# Patient Record
Sex: Female | Born: 1990 | Hispanic: Yes | Marital: Single | Smoking: Never smoker
Health system: Southern US, Community
[De-identification: ages and names within clinical notes are randomized; demographics above are authoritative.]

## PROBLEM LIST (undated history)

## (undated) DIAGNOSIS — Z789 Other specified health status: Secondary | ICD-10-CM

## (undated) HISTORY — PX: NO PAST SURGERIES: SHX2092

## (undated) HISTORY — DX: Other specified health status: Z78.9

---

## 2011-05-30 ENCOUNTER — Encounter: Payer: Self-pay | Admitting: Family Medicine

## 2011-05-30 ENCOUNTER — Ambulatory Visit (INDEPENDENT_AMBULATORY_CARE_PROVIDER_SITE_OTHER): Payer: Self-pay | Admitting: Family Medicine

## 2011-05-30 VITALS — BP 117/77 | Temp 98.0°F | Wt 171.0 lb

## 2011-05-30 DIAGNOSIS — Z3400 Encounter for supervision of normal first pregnancy, unspecified trimester: Secondary | ICD-10-CM | POA: Insufficient documentation

## 2011-05-30 DIAGNOSIS — N898 Other specified noninflammatory disorders of vagina: Secondary | ICD-10-CM

## 2011-05-30 DIAGNOSIS — O0930 Supervision of pregnancy with insufficient antenatal care, unspecified trimester: Secondary | ICD-10-CM

## 2011-05-30 LAB — POCT URINALYSIS DIP (DEVICE)
Ketones, ur: NEGATIVE mg/dL
Protein, ur: NEGATIVE mg/dL
Specific Gravity, Urine: 1.02 (ref 1.005–1.030)
pH: 7 (ref 5.0–8.0)

## 2011-05-30 NOTE — Patient Instructions (Signed)
Embarazo - Tercer trimestre (Pregnancy - Third Trimester) El tercer trimestre del embarazo (los ltimos 3 meses) es el perodo de cambios ms rpidos que atraviesan usted y el beb. El aumento de peso es ms rpido. El beb alcanza un largo de aproximadamente 50 cm (20 pulgadas) y pesa entre 2,700 y 4,500 kg (6 a 10 libras). El beb gana ms tejido graso y ya est listo para la vida fuera del cuerpo de la madre. Mientras estn en el interior, los bebs tienen perodos de sueo y vigilia, succionan el pulgar y tienen hipo. Quizs sienta pequeas contracciones del tero. Este es el falso trabajo de parto. Tambin se las conoce como contracciones de Braxton-Hicks. Es como una prctica del parto. Los problemas ms habituales de esta etapa del embarazo incluyen mayor dificultad para respirar, hinchazn de las manos y los pies por retencin de lquidos y la necesidad de orinar con ms frecuencia debido a que el tero y el beb presionan sobre la vejiga.  EXAMENES PRENATALES  Durante los exmenes prenatales, deber seguir realizando pruebas de sangre, segn avance el embarazo. Estas pruebas se realizan para controlar su salud y la del beb. Tambin se realizan anlisis de sangre para conocer los niveles de hemoglobina. La anemia (bajo nivel de hemoglobina) es frecuente durante el embarazo. Para prevenirla, se administran hierro y vitaminas. Tambin le harn nuevas pruebas para descartar la diabetes. Podrn repetirle algunas de las pruebas que le hicieron previamente.   En cada visita le medirn el tamao del tero. Es para asegurarse de que el beb se desarrolla correctamente.   Tambin en cada visita la pesarn. Esto se realiza para asegurarse de que aumenta de peso al ritmo indicado y que usted y su beb evolucionan normalmente.   En algunas ocasiones se realiza una ecografa para confirmar el correcto desarrollo y evolucin del beb. Esta prueba se realiza con ondas sonoras inofensivas para el beb, de modo  que el profesional pueda calcular con ms precisin la fecha del parto.   Discuta las posibilidades de la anestesia si necesita cesrea.  Algunas veces se realizan pruebas especializadas del lquido amnitico que rodea al beb. Esta prueba se denomina amniocentesis. El lquido amnitico se obtiene introduciendo una aguja en el abdomen (vientre). En ocasiones se lleva a cabo cerca del final del embarazo, si es necesario adelantar el parto. En este caso se realiza para asegurarse de que los pulmones del beb estn lo suficientemente maduros como para que pueda vivir fuera del tero. CAMBIOS QUE OCURREN EN EL TERCER TRIMESTRE DEL EMBARAZO Su organismo atravesar diferentes cambios durante el embarazo que varan de una persona a otra. Converse con el profesional que la asiste acerca los cambios que usted note y que la preocupen.  Durante el ltimo trimestre probablemente sienta un aumento del apetito. Es normal tener "antojos" de ciertas comidas. Esto vara de una persona a otra y de un embarazo a otro.   Podrn aparecer las primeras estras en las caderas, abdomen y mamas. Estos son cambios normales del cuerpo durante el embarazo. No existen medicamentos ni ejercicios que puedan prevenir estos cambios.   El estreimiento puede tratarse con un laxante o agregando fibra a su dieta. Beber grandes cantidades de lquidos, tomar fibras en forma de verduras, frutas y granos integrales es de gran ayuda.   Tambin es beneficioso practicar actividad fsica. Si ha sido una persona activa hasta el embarazo, podr continuar con la mayora de las actividades durante el mismo. Si ha sido menos activa, puede ser beneficioso   que comience con un programa de ejercicios, como realizar caminatas. Consulte con el profesional que la asiste antes de comenzar un programa de ejercicios.   Evite el consumo de cigarrillos, el alcohol, los medicamentos no prescritos y las "drogas de la calle" durante el embarazo. Estas sustancias  qumicas afectan la formacin y el desarrollo del beb. Evite estas sustancias durante todo el embarazo para asegurar el nacimiento de un beb sano.   Dolor de espalda, venas varicosas y hemorroides podran aparecer o empeorar.   Los movimientos del beb pueden ser ms bruscos y aparecer ms a menudo.   Puede que note dificultades para respirar facilmente.   El ombligo podra salrsele hacia afuera.   Puede segregar un lquido amarillento (calostro) de las mamas.   Puede segregar mucus con sangre. Esto normalmente ocurre unos pocos das a una semana antes de que comience el trabajo de parto.  INSTRUCCIONES PARA EL CUIDADO DOMICILIARIO  La mayor parte de los cuidados que se aconsejan son los mismos que los indicados para las primeras etapas del embarazo. Es importante que concurra a todas las citas con el profesional y siga sus instrucciones con respecto a los medicamentos que deba utilizar, a la actividad fsica y a la dieta.   Durante el embarazo debe obtener nutrientes para usted y para su beb. Consuma alimentos balanceados a intervalos regulares. Elija alimentos como carne, pescado, leche y otros productos lcteos descremados, verduras, frutas, panes integrales y cereales. El profesional le informar cul es el aumento de peso ideal.   Las relaciones sexuales pueden continuarse hasta casi el final del embarazo, si no se presentan otros problemas como prdida prematura (antes de tiempo) de lquido amnitico, hemorragia vaginal o dolor abdominal (en el vientre).   Realice actividad fsica todos los das, si no tiene restricciones. Consulte con el profesional que la asiste si no sabe con certeza si determinados ejercicios son seguros. El mayor aumento de peso se produce en los dos ltimos trimestres del embarazo.   Haga reposo con frecuencia, con las piernas elevadas, o segn lo necesite para evitar los calambres y el dolor de cintura.   Use un buen sostn o como los que se usan para hacer  deportes para aliviar la sensibilidad de las mamas. Tambin puede serle til si lo usa mientras duerme. Si pierde calostro, podr utilizar apsitos en el sostn.   No utilice la baera con agua caliente, baos turcos y saunas.   Colquese el cinturn de seguridad cuando conduzca. Este la proteger a usted y al beb en caso de accidente.   Evite comer carne cruda y el contacto con los utensilios y desperdicios de los gatos. Estos elementos contienen grmenes que pueden causar defectos de nacimiento en el beb.   Es fcil perder algo de orina durante el embarazo. Apretar y fortalecer los msculos de la pelvis la ayudar con este problema. Practique detener la miccin cuando est en el bao. Estos son los mismos msculos que necesita fortalecer. Son tambin los mismos msculos que utiliza cuando trata de evitar los gases. Puede practicar apretando estos msculos diez veces, y repetir esto tres veces por da aproximadamente. Una vez que conozca qu msculos debe contraer, no realice estos ejercicios durante la miccin. Puede favorecerle una infeccin si la orina vuelve hacia atrs.   Pida ayuda si tiene necesidades econmicas, de asesoramiento o nutricionales durante el embarazo. El profesional podr ayudarla con respecto a estas necesidades, o derivarla a otros especialistas.   Practique la ida hasta el hospital a modo   de prueba.   Tome clases prenatales junto con su pareja para comprender, practicar y hacer preguntas acerca del trabajo de parto y el nacimiento.   Prepare la habitacin del beb.   No viaje fuera de la ciudad a menos que sea absolutamente necesario y con el consejo del mdico.   Use slo zapatos bajos sin taco para tener un mejor equilibrio y prevenir cadas.  EL CONSUMO DE MEDICAMENTOS Y DROGAS DURANTE EL EMBARAZO  Contine tomando las vitaminas apropiadas para esta etapa tal como se le indic. Las vitaminas deben contener un miligramo de cido flico y deben suplementarse con  hierro. Guarde todas las vitaminas fuera del alcance de los nios. La ingestin de slo un par de vitaminas o comprimidos que contengan hierro pueden ocasionar la muerte en un beb o en un nio pequeo.   Evite el uso de medicamentos, inclusive los de venta libre, que no hayan sido prescritos o indicados por el profesional que la asiste. Algunos medicamentos pueden causar problemas fsicos al beb. Utilice los medicamentos de venta libre o de prescripcin para el dolor, el malestar o la fiebre, segn se lo indique el profesional que lo asiste. No utilice aspirina, ibuprofeno (Motrin, Advil, Nuprin) o naproxeno (Aleve) a menos que el profesional la autorice.   El alcohol se asocia a cierto nmero de defectos del nacimiento, incluido el sndrome de alcoholismo fetal. Debe evitar el consumo de alcohol en cualquiera de sus formas. El cigarrillo causa nacimientos prematuros y bebs de bajo peso al nacer. Las drogas de la calle son muy nocivas para el beb y estn absolutamente prohibidas. Un beb que nace de una madre adicta, ser adicto al nacer. Ese beb tendr los mismos sntomas de abstinencia que un adulto.   Infrmele al profesional si consume alguna droga.  SOLICITE ATENCIN MDICA SI: Tiene alguna preocupacin durante el embarazo. Es mejor que llame para formular las preguntas si no puede esperar hasta la prxima visita, que sentirse preocupada por ellas.  DECISIONES ACERCA DE LA CIRCUNCISIN Usted puede saber o no cul es el sexo de su beb. Si es un varn, ste es el momento de pensar acerca de la circuncisin. La circuncisin es la extirpacin del prepucio. Esta es la piel que cubre el extremo sensible del pene. No hay un motivo mdico que lo justifique. Generalmente la decisin se toma segn lo que sea popular en ese momento, o se basa en creencias religiosas. Podr conversar estos temas con el profesional que la asiste. SOLICITE ATENCIN MDICA DE INMEDIATO SI:  La temperatura oral se eleva  sin motivo por encima de 102 F (38.9 C) o segn le indique el profesional que la asiste.   Tiene una prdida de lquido por la vagina (canal de parto). Si sospecha una ruptura de las membranas, tmese la temperatura y llame al profesional para informarlo sobre esto.   Observa unas pequeas manchas, una hemorragia vaginal o elimina cogulos. Avsele al profesional acerca de la cantidad y de cuntos apsitos est utilizando.   Presenta un olor desagradable en la secrecin vaginal y observa un cambio en el color, de transparente a blanco.   Ha vomitado durante ms de 24 horas.   Presenta escalofros o fiebre.   Comienza a sentir falta de aire.   Siente ardor al orinar.   Baja o sube ms de 900 g (ms de 2 libras), o segn lo indicado por el profesional que la asiste. Observa que sbitamente se le hinchan el rostro, las manos, los pies o las   piernas.   Presenta dolor abdominal. Las molestias en el ligamento redondo son una causa benigna (no cancerosa) frecuente de dolor abdominal durante el embarazo, pero el profesional que la asiste deber evaluarlo.   Presenta dolor de cabeza intenso que no se alivia.   Si no siente los movimientos del beb durante ms de tres horas. Si piensa que el beb no se mueve tanto como lo haca habitualmente, coma algo que contenga azcar y recustese sobre el lado izquierdo durante una hora. El beb debe moverse al menos 4  5 veces por hora. Comunquese inmediatamente si el beb se mueve menos que lo indicado.   Se cae, se ve involucrada en un accidente automovilstico o sufre algn tipo de traumatismo.   En su hogar hay violencia mental o fsica.  Document Released: 11/01/2004 Document Revised: 01/11/2011 [employer] Patient Information 2012 [employer]. 

## 2011-05-30 NOTE — Progress Notes (Signed)
Addended by: Doreen Salvage on: 05/30/2011 12:17 PM   Modules accepted: Orders

## 2011-05-30 NOTE — Progress Notes (Signed)
Pulse 107 Has some pelvic pressure. All first visit labs today due at 1204 for 1hr gtt.

## 2011-05-30 NOTE — Assessment & Plan Note (Signed)
Genetic Screen Late to care  Anatomic [place]   Glucose Screen   GBS   Feeding Preference breast  Contraception Depo provera  Circumcision

## 2011-05-30 NOTE — Progress Notes (Signed)
   Subjective:    Reegan Mctighe is a G1P0 [redacted]w[redacted]d being seen today for her first obstetrical visit.  Her obstetrical history is significant for late to prenatal care.. Patient does intend to breast feed. Pregnancy history fully reviewed.  Patient reports no bleeding, no contractions, no cramping and no leaking.  Filed Vitals:   05/30/11 1044  BP: 117/77  Temp: 98 F (36.7 C)  Weight: 171 lb (77.565 kg)    HISTORY: OB History    Grav Para Term Preterm Abortions TAB SAB Ect Mult Living   1              # Outc Date GA Lbr Len/2nd Wgt Sex Del Anes PTL Lv   1 CUR              Past Medical History  Diagnosis Date  . No pertinent past medical history    Past Surgical History  Procedure Date  . No past surgeries    Family History  Problem Relation Age of Onset  . Diabetes Maternal Grandmother   . Diabetes Paternal Grandmother      Exam    Uterus:   34cm  Pelvic Exam:    Perineum: No Hemorrhoids, Normal Perineum   Vulva: normal, Bartholin's, Urethra, Skene's normal   Vagina:  normal mucosa   Cervix: no cervical motion tenderness, no lesions and nulliparous appearance   Adnexa: normal adnexa  System:     Skin: normal coloration and turgor, no rashes    Neurologic: oriented, normal, negative, no focal deficits   Extremities: normal strength, tone, and muscle mass   HEENT PERRLA and extra ocular movement intact   Mouth/Teeth mucous membranes moist, pharynx normal without lesions   Neck supple and no masses   Cardiovascular: regular rate and rhythm, no murmurs or gallops   Respiratory:  appears well, vitals normal, no respiratory distress, acyanotic, normal RR, ear and throat exam is normal, neck free of mass or lymphadenopathy, chest clear, no wheezing, crepitations, rhonchi, normal symmetric air entry   Abdomen: soft, non-tender; bowel sounds normal; no masses,  no organomegaly   Urinary: urethral meatus normal      Assessment:    Pregnancy: G1P0 Patient  Active Problem List  Diagnoses  . Late prenatal care  . Supervision of normal first pregnancy        Plan:     Initial labs drawn. Prenatal vitamins. Problem list reviewed and updated.  Ultrasound discussed; fetal survey: ordered.  Follow up in 2 weeks. 45% of 50 min visit spent on counseling and coordination of care.     Candelaria Celeste JEHIEL 05/30/2011

## 2011-05-30 NOTE — Progress Notes (Signed)
Addended by: Doreen Salvage on: 05/30/2011 01:30 PM   Modules accepted: Orders

## 2011-05-31 ENCOUNTER — Other Ambulatory Visit: Payer: Self-pay | Admitting: Family Medicine

## 2011-05-31 ENCOUNTER — Telehealth: Payer: Self-pay

## 2011-05-31 LAB — OBSTETRIC PANEL
Antibody Screen: NEGATIVE
Eosinophils Relative: 0 % (ref 0–5)
HCT: 33.2 % — ABNORMAL LOW (ref 36.0–46.0)
Hemoglobin: 11.2 g/dL — ABNORMAL LOW (ref 12.0–15.0)
Lymphocytes Relative: 17 % (ref 12–46)
Lymphs Abs: 1.5 10*3/uL (ref 0.7–4.0)
MCV: 89 fL (ref 78.0–100.0)
Monocytes Absolute: 0.4 10*3/uL (ref 0.1–1.0)
RBC: 3.73 MIL/uL — ABNORMAL LOW (ref 3.87–5.11)
Rubella: 189.4 IU/mL — ABNORMAL HIGH
WBC: 9 10*3/uL (ref 4.0–10.5)

## 2011-05-31 LAB — GLUCOSE TOLERANCE, 1 HOUR: Glucose, 1 Hour GTT: 135 mg/dL (ref 70–140)

## 2011-05-31 LAB — WET PREP, GENITAL
Clue Cells Wet Prep HPF POC: NONE SEEN
Trich, Wet Prep: NONE SEEN

## 2011-05-31 NOTE — Telephone Encounter (Signed)
Called pt with Spanish interpreter and informed pt of failed 1 hr gtt and that we needed to have her come in for a 3 hr gtt. I explained to the pt the procedure and that she needed to be here in facility for the duration.  Pt stated understanding and stated that it was ok to come 06/01/11 @ 0800.  Informed Antoinette for scheduling.

## 2011-05-31 NOTE — Telephone Encounter (Signed)
Message copied by Faythe Casa on Thu May 31, 2011  3:56 PM ------      Message from: Levie Heritage      Created: Thu May 31, 2011  2:27 PM       Needs 3hr gtt

## 2011-06-01 ENCOUNTER — Other Ambulatory Visit: Payer: Self-pay

## 2011-06-01 LAB — HEMOGLOBINOPATHY EVALUATION: Hgb S Quant: 0 %

## 2011-06-02 LAB — GLUCOSE TOLERANCE, 3 HOURS: Glucose Tolerance, 1 hour: 151 mg/dL (ref 70–189)

## 2011-06-04 ENCOUNTER — Other Ambulatory Visit: Payer: Self-pay | Admitting: Family Medicine

## 2011-06-04 MED ORDER — METRONIDAZOLE 500 MG PO TABS: 500.0000 mg | ORAL_TABLET | Freq: Two times a day (BID) | ORAL | Status: AC

## 2011-06-06 ENCOUNTER — Telehealth: Payer: Self-pay | Admitting: *Deleted

## 2011-06-06 NOTE — Telephone Encounter (Signed)
Message copied by Mannie Stabile on Wed Jun 06, 2011 11:45 AM ------      Message from: Levie Heritage      Created: Mon Jun 04, 2011  6:51 PM       + BV.  Sent prescription for flagyl 500mg  bid x 7 days to pharmacy.

## 2011-06-06 NOTE — Telephone Encounter (Signed)
Called patient with [facility]. Informed patient of BV, and need to pick up prescription. Pt understands.

## 2011-06-13 ENCOUNTER — Ambulatory Visit (INDEPENDENT_AMBULATORY_CARE_PROVIDER_SITE_OTHER): Payer: Self-pay | Admitting: Advanced Practice Midwife

## 2011-06-13 ENCOUNTER — Encounter: Payer: Self-pay | Admitting: Advanced Practice Midwife

## 2011-06-13 VITALS — BP 118/74 | Temp 97.0°F | Wt 174.4 lb

## 2011-06-13 DIAGNOSIS — O0930 Supervision of pregnancy with insufficient antenatal care, unspecified trimester: Secondary | ICD-10-CM

## 2011-06-13 DIAGNOSIS — Z3400 Encounter for supervision of normal first pregnancy, unspecified trimester: Secondary | ICD-10-CM

## 2011-06-13 LAB — POCT URINALYSIS DIP (DEVICE)
Bilirubin Urine: NEGATIVE
Ketones, ur: NEGATIVE mg/dL
Protein, ur: NEGATIVE mg/dL
Specific Gravity, Urine: 1.01 (ref 1.005–1.030)

## 2011-06-13 NOTE — Progress Notes (Signed)
3 hour GTT normal. Anatomy US scheduled 06/18/11. GBS at [facility].

## 2011-06-13 NOTE — Patient Instructions (Signed)
bresAmamantar al beb (Breastfeeding) LOS BENEFICIOS DE AMAMANTAR Para el beb  La primera leche (calostro ) ayuda al mejor funcionamiento del sistema digestivo del beb.   La leche tiene anticuerpos que provienen de la madre y que ayudan a prevenir las infecciones en el beb.   Hay una menor incidencia de asma, enfermedades alrgicas y SMSI (sndrome de muerte sbita nfantil).   Los nutrientes que contiene la [place] materna son mejores que las frmulas para el bibern y favorecen el desarrollo cerebral.   Los bebs amamantados sufren menos gases, clicos y constipacin.  Para la mam  La lactancia materna favorece el desarrollo de un vnculo muy especial entre la madre y el beb.   Es ms conveniente, siempre disponible a la Optician, dispensing y ms econmica que la [employer].   Consume caloras en la madre y la ayuda a perder el peso ganado durante el [place].   Favorece la contraccin del tero a su tamao normal, de manera ms rpida y [employer]ks[employer] H[employer]away las hemorragias luego del McPherson.   Las M.D.C. Holdings que amamantan tienen menor riesgo de Geophysical data processor de mama.  AMAMNTELO CON FRECUENCIA  Un beb sano, nacido a trmino, puede amamantarse con tanta frecuencia como cada hora, o espaciar las comidas cada tres horas.   Esta frecuencia variar de un beb a otro. Observe al beb cuando manifieste signos de hambre, antes que regirse por el reloj.   Amamntelo tan seguido como el beb lo solicite, o cuando usted sienta la necesidad de Paramedic sus [place].   Despierte al beb si han pasado 3  4 horas desde la ltima comida.   El amamantamiento frecuente la ayudar a producir ms [place] y a Education officer, community de Engineer, mining en los pezones e hinchazn de las [place].  LA POSICIN DEL BEB PARA AMAMANTARLO  Ya sea que se encuentre acostada o sentada, asegrese que el abdomen del beb enfrente el suyo.   Sostenga la mama con el pulgar por arriba y el resto de los dedos por debajo. Asegrese  que sus dedos se encuentren lejos del pezn y de la boca del beb.   Toque suavemente los labios del beb y la mejilla ms cercana a la mama con el dedo o el pezn.   Cuando la boca del beb se abra lo suficiente, introduzca el pezn y la zona oscura que lo rodea tanto como le sea posible dentro de la boca.   Coloque a beb cerca suyo de modo que su nariz y mejillas toquen las mamas al Texas Instruments.  LAS COMIDAS  La duracin de cada comida vara de un beb a otro y de [place] comida a [place].   El beb debe succionar alrededor American Financial o tres minutos para que le llegue [place]. Esto se denomina "bajada". Por este motivo, permita que el nio se alimente en cada mama todo lo que desee. Terminar de mamar cuando haya recibido la cantidad [place] de nutrientes.   Para detener la succin coloque su dedo en la comisura de la boca del nio y Midwife entre sus encas antes de quitarle la mama de la boca. Esto la ayudar a English as a second language teacher.  REDUCIR LA CONGESTIN DE LAS MAMAS  Durante la primera semana despus del parto, usted puede experimentar Monsanto Company. Cuando las mamas estn congestionadas, se sienten calientes, llenas y molestas al tacto. Puede reducir la congestin si:   Lo amamanta frecuentemente, cada 2-3 horas. Las mams que CDW Corporation pronto y con frecuencia tienen menos problemas  de congestin.   Coloque bolsas fras livianas entre cada mamada. Esto ayuda a reducir la hinchazn. Envuelva las bolsas de hielo en una toalla liviana para proteger su piel.   Aplique compresas hmedas calientes sobre la mama durante 5 a 10 minutos antes de amamantar al nio. Esto aumenta la circulacin y ayuda a que la leche fluya.   [place] suavemente la mama antes y durante la alimentacin.   Asegrese que el nio vaca al menos una mama antes de cambiar de lado.   Use un sacaleche para vaciar la mama si el beb se duerme o no se alimenta bien. Tambin podr quitarse la leche con esta bomba si  tiene que volver al trabajo o siente que las mamas estn congestionadas.   Evite los biberones, chupetes o complementar la alimentacin con agua o jugos en lugar de la leche materna.   Verifique que el beb se encuentra en la posicin correcta mientras lo alimenta.   Evite el cansancio, el estrs y la anemia   Use un soutien que sostenga bien sus mamas y evite los que tienen aro.   Consuma una dieta balanceada y beba lquidos en cantidad.  Si sigue estas indicaciones, la congestin debe mejorar en 24 a 48 horas. Si an tiene dificultades, consulte a su asesor en lactancia. TENDR SUFICIENTE LECHE MI BEB? Algunas veces las madres se preocupan acerca de si sus bebs tendrn la leche suficiente. Puede asegurarse que el beb tiene la leche suficiente si:  El beb succiona y escucha que traga activamente.   El nio se alimenta al menos 8 a 12 veces en 24 horas. Alimntelo hasta que se desprenda por sus propios medios o se quede dormido en la primera mama (al menos durante 10 a 20 minutos), luego ofrzcale el otro lado.   El beb moja 5 a 6 paales descartables (6 a 8 paales de tela) en 24 horas cuando tiene 5  6 das de vida.   Tiene al menos 2-3 deposiciones todos los das en los primeros meses. La leche materna es todo el alimento que el beb necesita. No es necesario que el nio ingiera agua o preparados de bibern. De hecho, para ayudar a que sus mamas produzcan ms leche, lo mejor es no darle al beb suplementos durante las primeras semanas.   La materia fecal debe ser blanda y amarillenta.   El beb debe aumentar 112 a 196 g por semana.  CUDESE Cuide sus mamas del siguiente modo:  Bese o dchese diariamente.   No lave sus pezones con jabn.   Comience a amamantar del lado izquierdo en una comida y del lado derecho en la siguiente.   Notar que aumenta el suministro de leche a los 2 a 5 das despus del parto. Puede sentir algunas molestias por la congestin, lo que hace que  sus mamas estn duras y sensibles. La congestin disminuye en 24 a 48 horas. Mientras tanto, aplique toallas hmedas calientes durante 5 a 10 minutos antes de amamantar. Un masaje suave y la extraccin de un poco de leche antes de amamantar ablandarn las mamas y har ms fcil que el beb se agarre. Use un buen sostn y seque al aire los pezones durante 10 a 15 minutos luego de cada alimentacin.   Solo utilice apsitos de algodn.   Utilice lanolina pura sobre los pezones luego de amamantar. No necesita lavarlos luego de alimentar al nio.  Cudese del siguiente modo:   Consuma alimentos bien balanceados y refrigerios nutritivos.     Dixie Dials, jugos de fruta y agua para Warehouse manager sed (alrededor de 8 vasos por Futures trader).   Descanse lo suficiente.   Aumente la ingesta de calcio en la dieta (1200mg /da).   Evite los alimentos que usted nota que puedan afectar al beb.  SOLICITE ATENCIN MDICA SI:  Tiene preguntas que formular o dificultades con la alimentacin a pecho.   Necesita ayuda.   Observa una zona dura, roja y que le duele en la zona de la mama, y se acompaa de fiebre de 100.5 F (38.1 C) o ms.   El beb est muy somnoliento como para alimentarse bien o tiene problemas para dormir.   El beb moja menos de 6 paales por da, a partir de los [address] de [place].   La piel del beb o la parte blanca de sus ojos est ms amarilla de lo que estaba en el hospital.   Se siente deprimida.  Document Released: 01/22/2005 Document Revised: 01/11/2011 [facility] Patient Information 2012 [place], [place].Vanetta Mulders - Systems analyst trimestre (Pregnancy - Third Trimester) El tercer trimestre del embarazo (los ltimos 3 meses) es el perodo de cambios ms rpidos que atraviesan usted y el beb. El aumento de peso es ms rpido. El beb alcanza un largo de aproximadamente 50 cm (20 pulgadas) y pesa entre 2,700 y 4,500 kg (6 a 10 libras). El beb gana ms tejido graso y ya est listo para la vida fuera del  cuerpo de la [place]. Mientras estn en el interior, los bebs tienen perodos de sueo y vigilia, Warehouse manager y tienen hipo. Quizs sienta pequeas contracciones del tero. Este es el falso trabajo de [place]. Tambin se las conoce como contracciones de [place]. Es como una prctica del parto. Los problemas ms habituales de esta etapa del embarazo incluyen mayor dificultad para respirar, hinchazn de las manos y los pies por retencin de lquidos y la necesidad de Geographical information systems officer con ms frecuencia debido a que el tero y el beb presionan sobre la vejiga.  EXAMENES PRENATALES  Durante los Manpower Inc, deber seguir realizando pruebas de [place], segn avance el [place]. Estas pruebas se realizan para controlar su salud y la del beb. Tambin se realizan anlisis de sangre para The Northwestern Mutual niveles de [place]. La anemia (bajo nivel de hemoglobina) es frecuente durante el embarazo. Para prevenirla, se administran hierro y vitaminas. Tambin le harn nuevas pruebas para descartar la diabetes. Podrn repetirle algunas de las Hovnanian Enterprises hicieron previamente.   En cada visita le medirn el tamao del tero. Es para asegurarse de que el beb se desarrolla correctamente.   Tambin en cada visita la pesarn. Esto se realiza para asegurarse de que aumenta de peso al ritmo indicado y que usted y su beb evolucionan normalmente.   En algunas ocasiones se realiza una ecografa para confirmar el correcto desarrollo y evolucin del beb. Esta prueba se realiza con ondas sonoras inofensivas para el beb, de modo que el profesional pueda calcular con ms precisin la fecha del [place].   Discuta las posibilidades de la anestesia si necesita cesrea.  Algunas veces se realizan pruebas especializadas del lquido amnitico que rodea al beb. Esta prueba se denomina amniocentesis. El lquido amnitico se obtiene introduciendo una aguja en el abdomen (vientre). En ocasiones se lleva a cabo cerca del final del  embarazo, si es Optician, dispensing. En este caso se realiza para asegurarse de que los pulmones del beb estn lo suficientemente maduros como para que pueda vivir fuera del tero. CAMBIOS QUE  OCURREN EN EL TERCER TRIMESTRE DEL EMBARAZO Su organismo atravesar diferentes cambios durante el embarazo que varan de Neomia Dear persona a Educational psychologist. [place] con el profesional que la asiste acerca los cambios que usted note y que la preocupen.  Durante el ltimo trimestre probablemente sienta un aumento del apetito. Es normal tener "antojos" de Development worker, community. Esto vara de Neomia Dear persona a otra y de un embarazo a Therapist, art.   Podrn aparecer las primeras estras en las caderas, abdomen y [place]. Estos son cambios normales del cuerpo durante el [place]. No existen medicamentos ni ejercicios que puedan prevenir CarMax.   El estreimiento puede tratarse con un laxante o agregando fibra a su dieta. Beber grandes cantidades de lquidos, tomar fibras en forma de verduras, frutas y granos integrales es de [place].   Tambin es beneficioso practicar actividad fsica. Si ha sido una persona Engineer, mining, podr continuar con la Harley-Davidson de las actividades durante el mismo. Si ha sido American Family Insurance, puede ser beneficioso que comience con un programa de ejercicios, Museum/gallery exhibitions officer. Consulte con el profesional que la asiste antes de comenzar un programa de ejercicios.   Evite el consumo de cigarrillos, el alcohol, los medicamentos no prescritos y las "drogas de la calle" durante el [place]. Estas sustancias qumicas afectan la formacin y el desarrollo del beb. Evite estas sustancias durante todo el embarazo para asegurar el nacimiento de un beb sano.   Dolor de espalda, venas varicosas y hemorroides podran aparecer o empeorar.   Los movimientos del beb pueden ser ms bruscos y aparecer ms a menudo.   Puede que note dificultades para respirar facilmente.   El ombligo podra salrsele hacia  afuera.   Puede segregar un lquido amarillento (calostro) de las [place].   Puede segregar mucus con sangre. Esto normalmente ocurre unos [address] a una semana antes de que comience el [place] de [place].  INSTRUCCIONES PARA EL CUIDADO DOMICILIARIO  La mayor parte de los cuidados que se aconsejan son los mismos que los indicados para las primeras etapas del Psychiatrist. Es importante que concurra a todas las citas con el profesional y siga sus instrucciones con Camera operator a los medicamentos que deba Chemical engineer, a la actividad fsica y a Psychologist, forensic.   Durante el embarazo debe obtener nutrientes para usted y para su beb. Consuma alimentos balanceados a intervalos regulares. Elija alimentos como carne, pescado, [place] y otros productos lcteos descremados, verduras, frutas, panes integrales y cereales. El Equities trader cul es el aumento de peso ideal.   Las relaciones sexuales pueden continuarse hasta casi el final del embarazo, si no se presentan otros problemas como prdida prematura (antes de tiempo) de lquido amnitico, hemorragia vaginal o dolor abdominal (en el vientre).   Realice Tesoro Corporation, si no tiene restricciones. Consulte con el profesional que la asiste si no sabe con certeza si determinados ejercicios son seguros. El mayor aumento de peso se produce Foot Locker ltimos trimestres del [place].   Haga reposo con frecuencia, con las piernas elevadas, o segn lo necesite para evitar los calambres y el dolor de cintura.   Use un buen sostn o como los que se usan para hacer deportes para Paramedic la sensibilidad de las [place]. Tambin puede serle til si lo [place] mientras duerme. Si pierde Product manager, podr Parker Hann[employer]in.   No utilice la baera con agua caliente, baos turcos y saunas.   Colquese el cinturn de seguridad cuando conduzca. Este la proteger a  usted y al beb en caso de accidente.   Evite comer carne cruda y el contacto con los utensilios  y desperdicios de los gatos. Estos elementos contienen grmenes que pueden causar defectos de nacimiento en el beb.   Es fcil perder algo de orina durante el [place]. Apretar y Chief Operating Officer los msculos de la pelvis la ayudar con este problema. Practique detener la miccin cuando est en el bao. Estos son los mismos msculos que Development worker, international aid. Son TEPPCO Partners mismos msculos que utiliza cuando trata de Ryder System gases. Puede practicar apretando estos msculos WellPoint, y repetir esto tres veces por da aproximadamente. Una vez que conozca qu msculos debe contraer, no realice estos ejercicios durante la miccin. Puede favorecerle una infeccin si la orina vuelve hacia atrs.   Pida ayuda si tiene necesidades econmicas, de asesoramiento o nutricionales durante el [place]. El profesional podr ayudarla con respecto a estas necesidades, o derivarla a otros especialistas.   Practique la ida Dollar General hospital a modo de [place].   Tome clases prenatales junto con su pareja para comprender, practicar y hacer preguntas acerca del Aleen Campi de parto y el nacimiento.   Prepare la habitacin del beb.   No viaje fuera de la ciudad a menos que sea absolutamente necesario y con el consejo del mdico.   Use slo zapatos bajos sin taco para tener un mejor equilibrio y prevenir cadas.  EL CONSUMO DE MEDICAMENTOS Y DROGAS DURANTE EL EMBARAZO  Contine tomando las vitaminas apropiadas para esta etapa tal como se le indic. Las vitaminas deben contener un miligramo de cido flico y deben suplementarse con hierro. Guarde todas las vitaminas fuera del alcance de los nios. La ingestin de slo un par de vitaminas o comprimidos que contengan hierro pueden ocasionar la Newmont Mining en un beb o en un nio pequeo.   Evite el uso de [place], inclusive los de venta [place], que no hayan sido prescritos o indicados por el profesional que la asiste. Algunos medicamentos pueden causar problemas fsicos al beb.  Utilice los medicamentos de venta libre o de prescripcin para Chief Technology Officer, Environmental health practitioner o la [place], segn se lo indique el profesional que lo asiste. No utilice aspirina, ibuprofeno (Motrin, Advil, Nuprin) o naproxeno (Aleve) a menos que el profesional la autorice.   El alcohol se asocia a cierto nmero de defectos del nacimiento, incluido el sndrome de alcoholismo fetal. Debe evitar el consumo de alcohol en cualquiera de sus formas. El cigarrillo causa nacimientos prematuros y bebs de bajo peso al nacer. Las drogas de la calle son muy nocivas para el beb y estn absolutamente prohibidas. Un beb que nace de American Express, ser adicto al nacer. Ese beb tendr los mismos sntomas de abstinencia que un adulto.   Infrmele al profesional si consume alguna droga.  SOLICITE ATENCIN MDICA SI: Tiene alguna preocupacin Academic librarian. Es mejor que llame para formular las preguntas si no puede esperar hasta la prxima visita, que sentirse preocupada por ellas.  DECISIONES ACERCA DE LA CIRCUNCISIN Usted puede saber o no cul es el sexo de su beb. Si es un varn, ste es el momento de pensar acerca de la circuncisin. La circuncisin es la extirpacin del prepucio. Esta es la piel que cubre el extremo sensible del pene. No hay un motivo mdico que lo justifique. Generalmente la decisin se toma segn lo que sea popular en ese momento, o se basa en creencias religiosas. Podr conversar estos temas con el profesional que la asiste. SOLICITE ATENCIN  MDICA DE INMEDIATO SI:  La temperatura oral se eleva sin motivo por encima de 102 F (38.9 C) o segn le indique el profesional que la asiste.   Tiene una prdida de lquido por la vagina (canal de parto). Si sospecha una ruptura de las [place], tmese la temperatura y llame al profesional para informarlo sobre esto.   Observa unas pequeas manchas, una hemorragia vaginal o elimina cogulos. Avsele al profesional acerca de la cantidad y de  cuntos apsitos est utilizando.   Presenta un olor desagradable en la secrecin vaginal y observa un cambio en el color, de transparente a blanco.   Ha vomitado durante ms de 24 horas.   Presenta escalofros o fiebre.   Comienza a sentir falta de aire.   Siente ardor al Beatrix Shipper.   [place] o sube ms de 900 g (ms de 2 libras), o segn lo indicado por el profesional que la asiste. Observa que sbitamente se le hinchan el rostro, las manos, los pies o las piernas.   Presenta dolor abdominal. Las molestias en el ligamento redondo son Neomia Dear causa benigna (no cancerosa) frecuente de Engineer, mining abdominal durante el Psychiatrist, pero el profesional que la asiste deber evaluarlo.   Presenta dolor de cabeza intenso que no se [place].   Si no siente los movimientos del beb durante ms de tres horas. Si piensa que el beb no se mueve tanto como lo haca habitualmente, coma algo que Psychologist, clinical y Target Corporation lado izquierdo durante [place]. El beb debe moverse al menos 4  5 veces por hora. Comunquese inmediatamente si el beb se mueve menos que lo indicado.   Se cae, se ve involucrada en un accidente automovilstico o sufre algn tipo de traumatismo.   En su hogar hay violencia mental o fsica.  Document Released: 11/01/2004 Document Revised: 01/11/2011 [facility] Patient Information 2012 [place], [place].Medroxyprogesterone injection [Contraceptive] Qu es este medicamento? Las inyecciones anticonceptivas de MEDROXIPROGESTERONA previenen Firefighter. Las ConocoPhillips brindarn control de la natalidad durante 3 meses. La Depo-subQ Provera 104 se utiliza tambin para tratar Starwood Hotels relacionado con endometriosis. Este medicamento puede ser utilizado para otros usos; si tiene alguna pregunta consulte con su proveedor de atencin mdica o con su farmacutico. Qu le debo informar a mi profesional de la salud antes de tomar este medicamento? Necesita saber si usted presenta alguno de los  siguientes problemas o situaciones: -si consume alcohol con frecuencia -asma -enfermedad vascular o antecedente de cogulos sanguneos en los pulmones o las piernas -enfermedad de los [place], como osteoporosis -cncer de mama -diabetes -trastornos de la alimentacin (anorexia nerviosa o bulimia) -alta presin sangunea -infecciones por VIH o SIDA -enfermedad renal -enfermedad heptica -depresin mental -migraa -convulsiones -derrame cerebral -fuma tabaco -sangrado vaginal -una reaccin alrgica o inusual a la medroxiprogesterona, otras hormonas, otros medicamentos, alimentos, colorantes o conservantes -si est embarazada o buscando quedar embarazada -si est amamantando a un beb Cmo debo utilizar este medicamento? El anticonceptivo de Depo-Provera se inyecta por va intramuscular. La Depo-SubQ Provera 104 se inyecta por va subcutnea. Las Auto-Owners Insurance un profesional de Radiographer, therapeutic. Usted no puede estar embarazada antes de recibir una inyeccin. La inyeccin normalmente se aplica durante los primeros 5 das despus de comenzar un perodo menstrual o 6 semanas despus de un parto. Hable con su pediatra para informarse acerca del uso de este medicamento en nios. Puede requerir atencin especial. Estas inyecciones han sido usadas en nias que han empezados a tener perodos [place]. Sobredosis: Pngase en contacto inmediatamente con un  centro toxicolgico o una sala de urgencia si usted cree que haya tomado demasiado medicamento. ATENCIN: Reynolds American es solo para usted. No comparta este medicamento con nadie. Qu sucede si me olvido de una dosis? Trate de no olvidar ninguna dosis. Para mantener el control de natalidad necesitar una inyeccin cada 3 meses. Si no puede asistir a una cita, comunquese con su profesional de la salud para que se la [place]. Si espera ms de 13 semanas entre las inyecciones anticonceptivos de Depo-Provera o ms de 14 semanas entre inyecciones  anticonceptivos de Depo-subQ Provera 104, puede quedarse [place]. Si no puede asistir a su cita utilice otro mtodo anticonceptivo. Tal vez deba hacerse una prueba de embarazo antes de recibir otra inyeccin. Qu puede interactuar con este medicamento? No tome esta medicina con ninguno de los siguientes medicamentos: -bosentano Esta medicina tambin puede interactuar con los siguientes medicamentos: -aminoglutethimide -antibiticos o medicamentos para infecciones, especialmente rifampicina, rifabutina, rifapentina y griseofulvina -aprepitant -barbitricos, tales como el fenobarbital o primidona -bexaroteno -carbamazepina -medicamentos para convulsiones, tales como etotona, felbamato, [place], [place], topiramato -modafinilo -hierba de [address] ser que esta lista no menciona todas las posibles interacciones. Informe a su profesional de Beazer Homes de Ingram Micro Inc productos a base de hierbas, medicamentos de [place] o suplementos nutritivos que est tomando. Si usted fuma, consume bebidas alcohlicas o si utiliza drogas ilegales, indqueselo tambin a su profesional de Beazer Homes. Algunas sustancias pueden interactuar con su medicamento. A qu debo estar atento al usar PPL Corporation? Este medicamento no la protege de la infeccin por VIH (SIDA) ni de otras enfermedades de transmisin sexual. El uso de este producto puede provocar una prdida de calcio de sus huesos. La prdida de calcio puede provocar huesos dbiles (osteoporosis). Slo use este producto durante ms de 2 aos si otras formas de anticonceptivos no son apropiados para usted. Mientre ms tiempo use este producto para el control de la natalidad, tendr ms riesgo de Marine scientist de NiSource. Consulte a su profesional de Contractor de cmo puede Big Lots fuertes. Puede experimentar un cambio en el patrn de sangrado o periodos irregulares. Muchas mujeres dejan de tener periodos Coca Cola. Si recibe sus inyecciones a tiempo, la posibilidad de quedarse embarazada es muy baja. Si cree que podr AES Corporation, visite a su profesional de la salud lo antes posible. Si desea quedar embarazada dentro del prximo ao, informe a su profesional de [employer] Homes. El [place] de este medicamento puede perdurar durante mucho tiempo despus de recibir su ltima inyeccin. Qu efectos secundarios puedo tener al Boston Scientific este medicamento? Efectos secundarios que debe informar a su mdico o a Producer, television/film/video de la salud tan pronto como sea posible: -Therapist, art como erupcin cutnea, picazn o urticarias, hinchazn de la cara, labios o lengua -secreciones o sensibilidad de las mamas -problemas respiratorios -cambios en la visin -depresin -sensacin de desmayos o mareos, cadas -fiebre -dolor en el abdomen, pecho, entrepierna o piernas -problemas de coordinacin, del habla, al caminar -cansancio o debilidad inusual -color amarillento de los ojos o la piel Efectos secundarios que, por lo general, no requieren Psychologist, prison and probation services (debe informarlos a mdico o a Producer, television/film/video de la salud si persisten o si son molestos): -cne -retencin de lquidos e hinchazn -dolor de cabeza -perodos menstruales irregulares, manchando o ausencia de perodos menstruales -dolor, picazn o reaccin cutnea temporal en el lugar de la inyeccin -aumento de peso Puede ser que esta lista no menciona todos los posibles [place]  secundarios. Comunquese a su mdico por asesoramiento mdico [employer]. Usted puede informar los efectos secundarios a la FDA por telfono al 1-800-FDA-1088. Dnde debo guardar mi medicina? No se aplica en este caso. Un profesional de Associate Professor las inyecciones. ATENCIN: Este folleto es un resumen. Puede ser que no cubra toda la posible informacin. Si usted tiene preguntas acerca de esta medicina, consulte con su mdico, su farmacutico o su  profesional de Radiographer, therapeutic.  2012, Elsevier/Gold Standard. (04/05/2008 3:09:00 PM)

## 2011-06-13 NOTE — Progress Notes (Signed)
Pulse: 85

## 2011-06-18 ENCOUNTER — Ambulatory Visit (HOSPITAL_COMMUNITY)
Admission: RE | Admit: 2011-06-18 | Discharge: 2011-06-18 | Disposition: A | Discharge status: Home / Self Care | Payer: Self-pay | Source: Ambulatory Visit | Attending: Family Medicine | Admitting: Family Medicine

## 2011-06-18 DIAGNOSIS — Z3400 Encounter for supervision of normal first pregnancy, unspecified trimester: Secondary | ICD-10-CM

## 2011-06-18 DIAGNOSIS — O0930 Supervision of pregnancy with insufficient antenatal care, unspecified trimester: Secondary | ICD-10-CM | POA: Insufficient documentation

## 2011-06-18 DIAGNOSIS — Z1389 Encounter for screening for other disorder: Secondary | ICD-10-CM | POA: Insufficient documentation

## 2011-06-18 DIAGNOSIS — Z363 Encounter for antenatal screening for malformations: Secondary | ICD-10-CM | POA: Insufficient documentation

## 2011-06-18 DIAGNOSIS — O358XX0 Maternal care for other (suspected) fetal abnormality and damage, not applicable or unspecified: Secondary | ICD-10-CM | POA: Insufficient documentation

## 2011-06-27 ENCOUNTER — Ambulatory Visit (INDEPENDENT_AMBULATORY_CARE_PROVIDER_SITE_OTHER): Payer: Self-pay | Admitting: Advanced Practice Midwife

## 2011-06-27 ENCOUNTER — Encounter: Payer: Self-pay | Admitting: Advanced Practice Midwife

## 2011-06-27 VITALS — BP 120/75 | Temp 98.1°F | Wt 182.0 lb

## 2011-06-27 DIAGNOSIS — Z3490 Encounter for supervision of normal pregnancy, unspecified, unspecified trimester: Secondary | ICD-10-CM

## 2011-06-27 DIAGNOSIS — O0930 Supervision of pregnancy with insufficient antenatal care, unspecified trimester: Secondary | ICD-10-CM

## 2011-06-27 LAB — POCT URINALYSIS DIP (DEVICE)
Bilirubin Urine: NEGATIVE
Glucose, UA: NEGATIVE mg/dL
Hgb urine dipstick: NEGATIVE
Ketones, ur: NEGATIVE mg/dL
Specific Gravity, Urine: 1.015 (ref 1.005–1.030)
Urobilinogen, UA: 0.2 mg/dL (ref 0.0–1.0)

## 2011-06-27 NOTE — Progress Notes (Signed)
GC/Chlam/GBS done. Feels well.

## 2011-06-27 NOTE — Patient Instructions (Signed)
Trabajo de parto y parto normal (Normal Labor and Delivery) En primer lugar, su mdico debe estar seguro de que usted est en trabajo de parto. Algunos signos son:  Puede haber eliminado el "tapn mucoso" antes que comience el trabajo de parto. Se trata de una pequea cantidad de mucus con sangre.   Tiene contracciones uterinas regulares.   El tiempo entre las contracciones se acorta.   Las molestias y el dolor se hacen gradualmente ms intensos.   El dolor se ubica principalmente en la espalda.   Los dolores empeoran al caminar.   El cuello del tero (la apertura del tero se hace ms delgada, comienza a borrarse, y se abre (se dilata).  Una vez que se encuentre en trabajo de parto y sea admitida en el hospital, el mdico har lo siguiente:  Un examen fsico completo.   Controlar sus signos vitales (presin arterial, pulso, temperatura y la frecuencia cardaca fetal).   Realizar un examen vaginal (usando un guante estril y lubricante para determinar:   La posicin (presentacin) del beb (ceflica [vertex] o nalgas primero).   El nivel (plano) de la cabeza del beb en el canal de parto.   El borramiento y dilatacin del cuello del tero.   Le rasurarn el vello pbico y le aplicarn una enema segn lo considere el mdico y las circunstancias.   Generalmente se coloca un monitor electrnico sobre el abdomen. El monitor sigue la duracin e intensidad de las contracciones, as como la frecuencia cardaca del beb.   Generalmente, el profesional inserta una va intravenosa en el brazo para administrarle agua azucarada. Esta es una medida de precaucin, de modo que puedan administrarle rpidamente medicamentos durante el trabajo de parto.  EL TRABAJO DE PARTO Y PARTO NORMALES SE DIVIDEN EN 3 ETAPAS: Primera etapa Comienzan las contracciones regulares y el cuello comienza a borrarse y dilatarse. Esta etapa puede durar entre 3 y 15 horas. El final de la primera etapa se considera  cuando el cuello est borrado en un 100% y se ha dilatado 10 cm. Le administrarn analgsicos por:  Inyeccin (morfina, demerol, etc.).   Anestesia regional (espinal, caudal o epidural, anestsicos colocados en diferentes regiones de la columna vertebral). Podrn administrarle medicamentos para el dolor en la regin paracervical, que consiste en la aplicacin de un anestsico inyectable en cada uno de los lados del cuello del tero.  La embarazada puede requerir un "parto natural" , es decir no recibir medicamentos o anestesia durante el trabajo de parto y el parto. Segunda etapa En este momento el beb baja a travs del canal de parto (vagina) y nace. Esto puede durar entre 1 y 4 horas. A medida que el beb asoma la cabeza por el canal de parto, podr sentir una sensacin similar a cuando mueve el intestino. Sentir el impulse de empujar con fuerza hasta que el nio salga. A medida que la cabecita baja, el mdico decidir si realiza una episiotoma (corte en el perineo y rea de la vagina) para evitar la ruptura de los tejidos). Luego del nacimiento del beb y la expulsin de la placenta, la episiotoma se sutura. En algunos casos se coloca a la madre una mscara con xido nitroso para facilitar la respiracin y aliviar el dolor. El final de la etapa 2 se produce cuando el beb ha salido completamente. Luego, cuando el cordn umbilical deja de pulsar, se pinza y se corta. Tercera etapa La tercera etapa comienza luego que el beb ha nacido y finaliza luego de la   expulsin de la placenta. Generalmente esto lleva entre 5 y 30 minutos. Luego de la expulsin de la placenta, le aplicarn un medicamento por va intravenosa para ayudar a contraer el tero y prevenir hemorragias. En la tercera etapa no hay dolor y generalmente no son necesarios los analgsicos. Si le han realizado una episiotoma, es el momento de repararla. Luego del parto, la mam es observada y controlada exhaustivamente durante 1  2 horas  para verificar que no hay sangrado en el post parto (hemorragias). Si pierde mucha sangre, le administrarn un medicamento para contraer el tero y detener la hemorragia. Document Released: 01/05/2008 Document Revised: 01/11/2011 ExitCare Patient Information 2012 [employer]. 

## 2011-06-27 NOTE — Progress Notes (Signed)
Pulse: 84

## 2011-06-27 NOTE — Progress Notes (Signed)
Addended by: Doreen Salvage on: 06/27/2011 02:08 PM   Modules accepted: Orders

## 2011-06-28 LAB — GC/CHLAMYDIA PROBE AMP, GENITAL
Chlamydia, DNA Probe: NEGATIVE
GC Probe Amp, Genital: NEGATIVE

## 2011-06-30 LAB — CULTURE, BETA STREP (GROUP B ONLY)

## 2011-07-04 ENCOUNTER — Ambulatory Visit (INDEPENDENT_AMBULATORY_CARE_PROVIDER_SITE_OTHER): Payer: Self-pay | Admitting: Obstetrics and Gynecology

## 2011-07-04 VITALS — BP 125/78 | Temp 97.7°F | Wt 183.0 lb

## 2011-07-04 DIAGNOSIS — Z3400 Encounter for supervision of normal first pregnancy, unspecified trimester: Secondary | ICD-10-CM

## 2011-07-04 LAB — POCT URINALYSIS DIP (DEVICE)
Bilirubin Urine: NEGATIVE
Glucose, UA: NEGATIVE mg/dL
Ketones, ur: NEGATIVE mg/dL
Nitrite: NEGATIVE
pH: 7 (ref 5.0–8.0)

## 2011-07-04 NOTE — Progress Notes (Signed)
S/sx labor and plans reviewed.

## 2011-07-04 NOTE — Progress Notes (Signed)
Pulse: 75 Edema trace in feet. No pain. Pressure in pelvic. Vaginal d/c thin white; no itch, no odor.

## 2011-07-04 NOTE — Patient Instructions (Signed)
Embarazo - Tercer trimestre (Pregnancy - Third Trimester) El tercer trimestre del embarazo (los ltimos 3 meses) es el perodo de cambios ms rpidos que atraviesan usted y el beb. El aumento de peso es ms rpido. El beb alcanza un largo de aproximadamente 50 cm (20 pulgadas) y pesa entre 2,700 y 4,500 kg (6 a 10 libras). El beb gana ms tejido graso y ya est listo para la vida fuera del cuerpo de la madre. Mientras estn en el interior, los bebs tienen perodos de sueo y vigilia, succionan el pulgar y tienen hipo. Quizs sienta pequeas contracciones del tero. Este es el falso trabajo de parto. Tambin se las conoce como contracciones de Braxton-Hicks. Es como una prctica del parto. Los problemas ms habituales de esta etapa del embarazo incluyen mayor dificultad para respirar, hinchazn de las manos y los pies por retencin de lquidos y la necesidad de orinar con ms frecuencia debido a que el tero y el beb presionan sobre la vejiga.  EXAMENES PRENATALES  Durante los exmenes prenatales, deber seguir realizando pruebas de sangre, segn avance el embarazo. Estas pruebas se realizan para controlar su salud y la del beb. Tambin se realizan anlisis de sangre para conocer los niveles de hemoglobina. La anemia (bajo nivel de hemoglobina) es frecuente durante el embarazo. Para prevenirla, se administran hierro y vitaminas. Tambin le harn nuevas pruebas para descartar la diabetes. Podrn repetirle algunas de las pruebas que le hicieron previamente.   En cada visita le medirn el tamao del tero. Es para asegurarse de que el beb se desarrolla correctamente.   Tambin en cada visita la pesarn. Esto se realiza para asegurarse de que aumenta de peso al ritmo indicado y que usted y su beb evolucionan normalmente.   En algunas ocasiones se realiza una ecografa para confirmar el correcto desarrollo y evolucin del beb. Esta prueba se realiza con ondas sonoras inofensivas para el beb, de modo  que el profesional pueda calcular con ms precisin la fecha del parto.   Discuta las posibilidades de la anestesia si necesita cesrea.  Algunas veces se realizan pruebas especializadas del lquido amnitico que rodea al beb. Esta prueba se denomina amniocentesis. El lquido amnitico se obtiene introduciendo una aguja en el abdomen (vientre). En ocasiones se lleva a cabo cerca del final del embarazo, si es necesario adelantar el parto. En este caso se realiza para asegurarse de que los pulmones del beb estn lo suficientemente maduros como para que pueda vivir fuera del tero. CAMBIOS QUE OCURREN EN EL TERCER TRIMESTRE DEL EMBARAZO Su organismo atravesar diferentes cambios durante el embarazo que varan de una persona a otra. Converse con el profesional que la asiste acerca los cambios que usted note y que la preocupen.  Durante el ltimo trimestre probablemente sienta un aumento del apetito. Es normal tener "antojos" de ciertas comidas. Esto vara de una persona a otra y de un embarazo a otro.   Podrn aparecer las primeras estras en las caderas, abdomen y mamas. Estos son cambios normales del cuerpo durante el embarazo. No existen medicamentos ni ejercicios que puedan prevenir estos cambios.   El estreimiento puede tratarse con un laxante o agregando fibra a su dieta. Beber grandes cantidades de lquidos, tomar fibras en forma de verduras, frutas y granos integrales es de gran ayuda.   Tambin es beneficioso practicar actividad fsica. Si ha sido una persona activa hasta el embarazo, podr continuar con la mayora de las actividades durante el mismo. Si ha sido menos activa, puede ser beneficioso   que comience con un programa de ejercicios, como realizar caminatas. Consulte con el profesional que la asiste antes de comenzar un programa de ejercicios.   Evite el consumo de cigarrillos, el alcohol, los medicamentos no prescritos y las "drogas de la calle" durante el embarazo. Estas sustancias  qumicas afectan la formacin y el desarrollo del beb. Evite estas sustancias durante todo el embarazo para asegurar el nacimiento de un beb sano.   Dolor de espalda, venas varicosas y hemorroides podran aparecer o empeorar.   Los movimientos del beb pueden ser ms bruscos y aparecer ms a menudo.   Puede que note dificultades para respirar facilmente.   El ombligo podra salrsele hacia afuera.   Puede segregar un lquido amarillento (calostro) de las mamas.   Puede segregar mucus con sangre. Esto normalmente ocurre unos pocos das a una semana antes de que comience el trabajo de parto.  INSTRUCCIONES PARA EL CUIDADO DOMICILIARIO  La mayor parte de los cuidados que se aconsejan son los mismos que los indicados para las primeras etapas del embarazo. Es importante que concurra a todas las citas con el profesional y siga sus instrucciones con respecto a los medicamentos que deba utilizar, a la actividad fsica y a la dieta.   Durante el embarazo debe obtener nutrientes para usted y para su beb. Consuma alimentos balanceados a intervalos regulares. Elija alimentos como carne, pescado, leche y otros productos lcteos descremados, verduras, frutas, panes integrales y cereales. El profesional le informar cul es el aumento de peso ideal.   Las relaciones sexuales pueden continuarse hasta casi el final del embarazo, si no se presentan otros problemas como prdida prematura (antes de tiempo) de lquido amnitico, hemorragia vaginal o dolor abdominal (en el vientre).   Realice actividad fsica todos los das, si no tiene restricciones. Consulte con el profesional que la asiste si no sabe con certeza si determinados ejercicios son seguros. El mayor aumento de peso se produce en los dos ltimos trimestres del embarazo.   Haga reposo con frecuencia, con las piernas elevadas, o segn lo necesite para evitar los calambres y el dolor de cintura.   Use un buen sostn o como los que se usan para hacer  deportes para aliviar la sensibilidad de las mamas. Tambin puede serle til si lo usa mientras duerme. Si pierde calostro, podr utilizar apsitos en el sostn.   No utilice la baera con agua caliente, baos turcos y saunas.   Colquese el cinturn de seguridad cuando conduzca. Este la proteger a usted y al beb en caso de accidente.   Evite comer carne cruda y el contacto con los utensilios y desperdicios de los gatos. Estos elementos contienen grmenes que pueden causar defectos de nacimiento en el beb.   Es fcil perder algo de orina durante el embarazo. Apretar y fortalecer los msculos de la pelvis la ayudar con este problema. Practique detener la miccin cuando est en el bao. Estos son los mismos msculos que necesita fortalecer. Son tambin los mismos msculos que utiliza cuando trata de evitar los gases. Puede practicar apretando estos msculos diez veces, y repetir esto tres veces por da aproximadamente. Una vez que conozca qu msculos debe contraer, no realice estos ejercicios durante la miccin. Puede favorecerle una infeccin si la orina vuelve hacia atrs.   Pida ayuda si tiene necesidades econmicas, de asesoramiento o nutricionales durante el embarazo. El profesional podr ayudarla con respecto a estas necesidades, o derivarla a otros especialistas.   Practique la ida hasta el hospital a modo   de prueba.   Tome clases prenatales junto con su pareja para comprender, practicar y hacer preguntas acerca del trabajo de parto y el nacimiento.   Prepare la habitacin del beb.   No viaje fuera de la ciudad a menos que sea absolutamente necesario y con el consejo del mdico.   Use slo zapatos bajos sin taco para tener un mejor equilibrio y prevenir cadas.  EL CONSUMO DE MEDICAMENTOS Y DROGAS DURANTE EL EMBARAZO  Contine tomando las vitaminas apropiadas para esta etapa tal como se le indic. Las vitaminas deben contener un miligramo de cido flico y deben suplementarse con  hierro. Guarde todas las vitaminas fuera del alcance de los nios. La ingestin de slo un par de vitaminas o comprimidos que contengan hierro pueden ocasionar la muerte en un beb o en un nio pequeo.   Evite el uso de medicamentos, inclusive los de venta libre, que no hayan sido prescritos o indicados por el profesional que la asiste. Algunos medicamentos pueden causar problemas fsicos al beb. Utilice los medicamentos de venta libre o de prescripcin para el dolor, el malestar o la fiebre, segn se lo indique el profesional que lo asiste. No utilice aspirina, ibuprofeno (Motrin, Advil, Nuprin) o naproxeno (Aleve) a menos que el profesional la autorice.   El alcohol se asocia a cierto nmero de defectos del nacimiento, incluido el sndrome de alcoholismo fetal. Debe evitar el consumo de alcohol en cualquiera de sus formas. El cigarrillo causa nacimientos prematuros y bebs de bajo peso al nacer. Las drogas de la calle son muy nocivas para el beb y estn absolutamente prohibidas. Un beb que nace de una madre adicta, ser adicto al nacer. Ese beb tendr los mismos sntomas de abstinencia que un adulto.   Infrmele al profesional si consume alguna droga.  SOLICITE ATENCIN MDICA SI: Tiene alguna preocupacin durante el embarazo. Es mejor que llame para formular las preguntas si no puede esperar hasta la prxima visita, que sentirse preocupada por ellas.  DECISIONES ACERCA DE LA CIRCUNCISIN Usted puede saber o no cul es el sexo de su beb. Si es un varn, ste es el momento de pensar acerca de la circuncisin. La circuncisin es la extirpacin del prepucio. Esta es la piel que cubre el extremo sensible del pene. No hay un motivo mdico que lo justifique. Generalmente la decisin se toma segn lo que sea popular en ese momento, o se basa en creencias religiosas. Podr conversar estos temas con el profesional que la asiste. SOLICITE ATENCIN MDICA DE INMEDIATO SI:  La temperatura oral se eleva  sin motivo por encima de 102 F (38.9 C) o segn le indique el profesional que la asiste.   Tiene una prdida de lquido por la vagina (canal de parto). Si sospecha una ruptura de las membranas, tmese la temperatura y llame al profesional para informarlo sobre esto.   Observa unas pequeas manchas, una hemorragia vaginal o elimina cogulos. Avsele al profesional acerca de la cantidad y de cuntos apsitos est utilizando.   Presenta un olor desagradable en la secrecin vaginal y observa un cambio en el color, de transparente a blanco.   Ha vomitado durante ms de 24 horas.   Presenta escalofros o fiebre.   Comienza a sentir falta de aire.   Siente ardor al orinar.   Baja o sube ms de 900 g (ms de 2 libras), o segn lo indicado por el profesional que la asiste. Observa que sbitamente se le hinchan el rostro, las manos, los pies o las   piernas.   Presenta dolor abdominal. Las molestias en el ligamento redondo son una causa benigna (no cancerosa) frecuente de dolor abdominal durante el embarazo, pero el profesional que la asiste deber evaluarlo.   Presenta dolor de cabeza intenso que no se alivia.   Si no siente los movimientos del beb durante ms de tres horas. Si piensa que el beb no se mueve tanto como lo haca habitualmente, coma algo que contenga azcar y recustese sobre el lado izquierdo durante una hora. El beb debe moverse al menos 4  5 veces por hora. Comunquese inmediatamente si el beb se mueve menos que lo indicado.   Se cae, se ve involucrada en un accidente automovilstico o sufre algn tipo de traumatismo.   En su hogar hay violencia mental o fsica.  Document Released: 11/01/2004 Document Revised: 01/11/2011 [employer] Patient Information 2012 [employer]. 

## 2011-07-11 ENCOUNTER — Ambulatory Visit (INDEPENDENT_AMBULATORY_CARE_PROVIDER_SITE_OTHER): Payer: Self-pay | Admitting: Advanced Practice Midwife

## 2011-07-11 ENCOUNTER — Encounter: Payer: Self-pay | Admitting: Advanced Practice Midwife

## 2011-07-11 VITALS — BP 129/77 | Temp 97.0°F | Wt 186.5 lb

## 2011-07-11 DIAGNOSIS — O0930 Supervision of pregnancy with insufficient antenatal care, unspecified trimester: Secondary | ICD-10-CM

## 2011-07-11 LAB — POCT URINALYSIS DIP (DEVICE)
Bilirubin Urine: NEGATIVE
Glucose, UA: NEGATIVE mg/dL
Ketones, ur: NEGATIVE mg/dL
Protein, ur: NEGATIVE mg/dL
Specific Gravity, Urine: 1.02 (ref 1.005–1.030)

## 2011-07-11 NOTE — Progress Notes (Signed)
Doing well. Rare contractions. Good FM. Reviewed signs to come in for and where to come.

## 2011-07-11 NOTE — Patient Instructions (Signed)
Trabajo de parto y parto normal (Normal Labor and Delivery) En primer lugar, su mdico debe estar seguro de que usted est en trabajo de parto. Algunos signos son:  Puede haber eliminado el "tapn mucoso" antes que comience el trabajo de parto. Se trata de una pequea cantidad de mucus con sangre.   Tiene contracciones uterinas regulares.   El tiempo entre las contracciones se acorta.   Las molestias y el dolor se hacen gradualmente ms intensos.   El dolor se ubica principalmente en la espalda.   Los dolores empeoran al caminar.   El cuello del tero (la apertura del tero se hace ms delgada, comienza a borrarse, y se abre (se dilata).  Una vez que se encuentre en trabajo de parto y sea admitida en el hospital, el mdico har lo siguiente:  Un examen fsico completo.   Controlar sus signos vitales (presin arterial, pulso, temperatura y la frecuencia cardaca fetal).   Realizar un examen vaginal (usando un guante estril y lubricante para determinar:   La posicin (presentacin) del beb (ceflica [vertex] o nalgas primero).   El nivel (plano) de la cabeza del beb en el canal de parto.   El borramiento y dilatacin del cuello del tero.   Le rasurarn el vello pbico y le aplicarn una enema segn lo considere el mdico y las circunstancias.   Generalmente se coloca un monitor electrnico sobre el abdomen. El monitor sigue la duracin e intensidad de las contracciones, as como la frecuencia cardaca del beb.   Generalmente, el profesional inserta una va intravenosa en el brazo para administrarle agua azucarada. Esta es una medida de precaucin, de modo que puedan administrarle rpidamente medicamentos durante el trabajo de parto.  EL TRABAJO DE PARTO Y PARTO NORMALES SE DIVIDEN EN 3 ETAPAS: Primera etapa Comienzan las contracciones regulares y el cuello comienza a borrarse y dilatarse. Esta etapa puede durar entre 3 y 15 horas. El final de la primera etapa se considera  cuando el cuello est borrado en un 100% y se ha dilatado 10 cm. Le administrarn analgsicos por:  Inyeccin (morfina, demerol, etc.).   Anestesia regional (espinal, caudal o epidural, anestsicos colocados en diferentes regiones de la columna vertebral). Podrn administrarle medicamentos para el dolor en la regin paracervical, que consiste en la aplicacin de un anestsico inyectable en cada uno de los lados del cuello del tero.  La embarazada puede requerir un "parto natural" , es decir no recibir medicamentos o anestesia durante el trabajo de parto y el parto. Segunda etapa En este momento el beb baja a travs del canal de parto (vagina) y nace. Esto puede durar entre 1 y 4 horas. A medida que el beb asoma la cabeza por el canal de parto, podr sentir una sensacin similar a cuando mueve el intestino. Sentir el impulse de empujar con fuerza hasta que el nio salga. A medida que la cabecita baja, el mdico decidir si realiza una episiotoma (corte en el perineo y rea de la vagina) para evitar la ruptura de los tejidos). Luego del nacimiento del beb y la expulsin de la placenta, la episiotoma se sutura. En algunos casos se coloca a la madre una mscara con xido nitroso para facilitar la respiracin y aliviar el dolor. El final de la etapa 2 se produce cuando el beb ha salido completamente. Luego, cuando el cordn umbilical deja de pulsar, se pinza y se corta. Tercera etapa La tercera etapa comienza luego que el beb ha nacido y finaliza luego de la   expulsin de la placenta. Generalmente esto lleva entre 5 y 30 minutos. Luego de la expulsin de la placenta, le aplicarn un medicamento por va intravenosa para ayudar a contraer el tero y prevenir hemorragias. En la tercera etapa no hay dolor y generalmente no son necesarios los analgsicos. Si le han realizado una episiotoma, es el momento de repararla. Luego del parto, la mam es observada y controlada exhaustivamente durante 1  2 horas  para verificar que no hay sangrado en el post parto (hemorragias). Si pierde mucha sangre, le administrarn un medicamento para contraer el tero y detener la hemorragia. Document Released: 01/05/2008 Document Revised: 01/11/2011 ExitCare Patient Information 2012 [employer]. 

## 2011-07-11 NOTE — Progress Notes (Signed)
Edema- feet    Pressure- lower abd  

## 2011-07-18 ENCOUNTER — Ambulatory Visit (INDEPENDENT_AMBULATORY_CARE_PROVIDER_SITE_OTHER): Payer: Self-pay | Admitting: Obstetrics and Gynecology

## 2011-07-18 VITALS — BP 128/70 | Temp 97.7°F | Wt 187.4 lb

## 2011-07-18 DIAGNOSIS — O0930 Supervision of pregnancy with insufficient antenatal care, unspecified trimester: Secondary | ICD-10-CM

## 2011-07-18 DIAGNOSIS — Z3400 Encounter for supervision of normal first pregnancy, unspecified trimester: Secondary | ICD-10-CM

## 2011-07-18 LAB — POCT URINALYSIS DIP (DEVICE)
Bilirubin Urine: NEGATIVE
Glucose, UA: NEGATIVE mg/dL
Hgb urine dipstick: NEGATIVE
Specific Gravity, Urine: 1.02 (ref 1.005–1.030)
Urobilinogen, UA: 0.2 mg/dL (ref 0.0–1.0)

## 2011-07-18 NOTE — Progress Notes (Signed)
Pulse: 81

## 2011-07-18 NOTE — Progress Notes (Signed)
Discussed FM awareness, s/sx labor and contraception - probably Depo.

## 2011-07-18 NOTE — Patient Instructions (Signed)
Embarazo - Tercer trimestre (Pregnancy - Third Trimester) El tercer trimestre del embarazo (los ltimos 3 meses) es el perodo de cambios ms rpidos que atraviesan usted y el beb. El aumento de peso es ms rpido. El beb alcanza un largo de aproximadamente 50 cm (20 pulgadas) y pesa entre 2,700 y 4,500 kg (6 a 10 libras). El beb gana ms tejido graso y ya est listo para la vida fuera del cuerpo de la madre. Mientras estn en el interior, los bebs tienen perodos de sueo y vigilia, succionan el pulgar y tienen hipo. Quizs sienta pequeas contracciones del tero. Este es el falso trabajo de parto. Tambin se las conoce como contracciones de Braxton-Hicks. Es como una prctica del parto. Los problemas ms habituales de esta etapa del embarazo incluyen mayor dificultad para respirar, hinchazn de las manos y los pies por retencin de lquidos y la necesidad de orinar con ms frecuencia debido a que el tero y el beb presionan sobre la vejiga.  EXAMENES PRENATALES  Durante los exmenes prenatales, deber seguir realizando pruebas de sangre, segn avance el embarazo. Estas pruebas se realizan para controlar su salud y la del beb. Tambin se realizan anlisis de sangre para conocer los niveles de hemoglobina. La anemia (bajo nivel de hemoglobina) es frecuente durante el embarazo. Para prevenirla, se administran hierro y vitaminas. Tambin le harn nuevas pruebas para descartar la diabetes. Podrn repetirle algunas de las pruebas que le hicieron previamente.   En cada visita le medirn el tamao del tero. Es para asegurarse de que el beb se desarrolla correctamente.   Tambin en cada visita la pesarn. Esto se realiza para asegurarse de que aumenta de peso al ritmo indicado y que usted y su beb evolucionan normalmente.   En algunas ocasiones se realiza una ecografa para confirmar el correcto desarrollo y evolucin del beb. Esta prueba se realiza con ondas sonoras inofensivas para el beb, de modo  que el profesional pueda calcular con ms precisin la fecha del parto.   Discuta las posibilidades de la anestesia si necesita cesrea.  Algunas veces se realizan pruebas especializadas del lquido amnitico que rodea al beb. Esta prueba se denomina amniocentesis. El lquido amnitico se obtiene introduciendo una aguja en el abdomen (vientre). En ocasiones se lleva a cabo cerca del final del embarazo, si es necesario adelantar el parto. En este caso se realiza para asegurarse de que los pulmones del beb estn lo suficientemente maduros como para que pueda vivir fuera del tero. CAMBIOS QUE OCURREN EN EL TERCER TRIMESTRE DEL EMBARAZO Su organismo atravesar diferentes cambios durante el embarazo que varan de una persona a otra. Converse con el profesional que la asiste acerca los cambios que usted note y que la preocupen.  Durante el ltimo trimestre probablemente sienta un aumento del apetito. Es normal tener "antojos" de ciertas comidas. Esto vara de una persona a otra y de un embarazo a otro.   Podrn aparecer las primeras estras en las caderas, abdomen y mamas. Estos son cambios normales del cuerpo durante el embarazo. No existen medicamentos ni ejercicios que puedan prevenir estos cambios.   El estreimiento puede tratarse con un laxante o agregando fibra a su dieta. Beber grandes cantidades de lquidos, tomar fibras en forma de verduras, frutas y granos integrales es de gran ayuda.   Tambin es beneficioso practicar actividad fsica. Si ha sido una persona activa hasta el embarazo, podr continuar con la mayora de las actividades durante el mismo. Si ha sido menos activa, puede ser beneficioso   que comience con un programa de ejercicios, como realizar caminatas. Consulte con el profesional que la asiste antes de comenzar un programa de ejercicios.   Evite el consumo de cigarrillos, el alcohol, los medicamentos no prescritos y las "drogas de la calle" durante el embarazo. Estas sustancias  qumicas afectan la formacin y el desarrollo del beb. Evite estas sustancias durante todo el embarazo para asegurar el nacimiento de un beb sano.   Dolor de espalda, venas varicosas y hemorroides podran aparecer o empeorar.   Los movimientos del beb pueden ser ms bruscos y aparecer ms a menudo.   Puede que note dificultades para respirar facilmente.   El ombligo podra salrsele hacia afuera.   Puede segregar un lquido amarillento (calostro) de las mamas.   Puede segregar mucus con sangre. Esto normalmente ocurre unos pocos das a una semana antes de que comience el trabajo de parto.  INSTRUCCIONES PARA EL CUIDADO DOMICILIARIO  La mayor parte de los cuidados que se aconsejan son los mismos que los indicados para las primeras etapas del embarazo. Es importante que concurra a todas las citas con el profesional y siga sus instrucciones con respecto a los medicamentos que deba utilizar, a la actividad fsica y a la dieta.   Durante el embarazo debe obtener nutrientes para usted y para su beb. Consuma alimentos balanceados a intervalos regulares. Elija alimentos como carne, pescado, leche y otros productos lcteos descremados, verduras, frutas, panes integrales y cereales. El profesional le informar cul es el aumento de peso ideal.   Las relaciones sexuales pueden continuarse hasta casi el final del embarazo, si no se presentan otros problemas como prdida prematura (antes de tiempo) de lquido amnitico, hemorragia vaginal o dolor abdominal (en el vientre).   Realice actividad fsica todos los das, si no tiene restricciones. Consulte con el profesional que la asiste si no sabe con certeza si determinados ejercicios son seguros. El mayor aumento de peso se produce en los dos ltimos trimestres del embarazo.   Haga reposo con frecuencia, con las piernas elevadas, o segn lo necesite para evitar los calambres y el dolor de cintura.   Use un buen sostn o como los que se usan para hacer  deportes para aliviar la sensibilidad de las mamas. Tambin puede serle til si lo usa mientras duerme. Si pierde calostro, podr utilizar apsitos en el sostn.   No utilice la baera con agua caliente, baos turcos y saunas.   Colquese el cinturn de seguridad cuando conduzca. Este la proteger a usted y al beb en caso de accidente.   Evite comer carne cruda y el contacto con los utensilios y desperdicios de los gatos. Estos elementos contienen grmenes que pueden causar defectos de nacimiento en el beb.   Es fcil perder algo de orina durante el embarazo. Apretar y fortalecer los msculos de la pelvis la ayudar con este problema. Practique detener la miccin cuando est en el bao. Estos son los mismos msculos que necesita fortalecer. Son tambin los mismos msculos que utiliza cuando trata de evitar los gases. Puede practicar apretando estos msculos diez veces, y repetir esto tres veces por da aproximadamente. Una vez que conozca qu msculos debe contraer, no realice estos ejercicios durante la miccin. Puede favorecerle una infeccin si la orina vuelve hacia atrs.   Pida ayuda si tiene necesidades econmicas, de asesoramiento o nutricionales durante el embarazo. El profesional podr ayudarla con respecto a estas necesidades, o derivarla a otros especialistas.   Practique la ida hasta el hospital a modo   de prueba.   Tome clases prenatales junto con su pareja para comprender, practicar y hacer preguntas acerca del trabajo de parto y el nacimiento.   Prepare la habitacin del beb.   No viaje fuera de la ciudad a menos que sea absolutamente necesario y con el consejo del mdico.   Use slo zapatos bajos sin taco para tener un mejor equilibrio y prevenir cadas.  EL CONSUMO DE MEDICAMENTOS Y DROGAS DURANTE EL EMBARAZO  Contine tomando las vitaminas apropiadas para esta etapa tal como se le indic. Las vitaminas deben contener un miligramo de cido flico y deben suplementarse con  hierro. Guarde todas las vitaminas fuera del alcance de los nios. La ingestin de slo un par de vitaminas o comprimidos que contengan hierro pueden ocasionar la muerte en un beb o en un nio pequeo.   Evite el uso de medicamentos, inclusive los de venta libre, que no hayan sido prescritos o indicados por el profesional que la asiste. Algunos medicamentos pueden causar problemas fsicos al beb. Utilice los medicamentos de venta libre o de prescripcin para el dolor, el malestar o la fiebre, segn se lo indique el profesional que lo asiste. No utilice aspirina, ibuprofeno (Motrin, Advil, Nuprin) o naproxeno (Aleve) a menos que el profesional la autorice.   El alcohol se asocia a cierto nmero de defectos del nacimiento, incluido el sndrome de alcoholismo fetal. Debe evitar el consumo de alcohol en cualquiera de sus formas. El cigarrillo causa nacimientos prematuros y bebs de bajo peso al nacer. Las drogas de la calle son muy nocivas para el beb y estn absolutamente prohibidas. Un beb que nace de una madre adicta, ser adicto al nacer. Ese beb tendr los mismos sntomas de abstinencia que un adulto.   Infrmele al profesional si consume alguna droga.  SOLICITE ATENCIN MDICA SI: Tiene alguna preocupacin durante el embarazo. Es mejor que llame para formular las preguntas si no puede esperar hasta la prxima visita, que sentirse preocupada por ellas.  DECISIONES ACERCA DE LA CIRCUNCISIN Usted puede saber o no cul es el sexo de su beb. Si es un varn, ste es el momento de pensar acerca de la circuncisin. La circuncisin es la extirpacin del prepucio. Esta es la piel que cubre el extremo sensible del pene. No hay un motivo mdico que lo justifique. Generalmente la decisin se toma segn lo que sea popular en ese momento, o se basa en creencias religiosas. Podr conversar estos temas con el profesional que la asiste. SOLICITE ATENCIN MDICA DE INMEDIATO SI:  La temperatura oral se eleva  sin motivo por encima de 102 F (38.9 C) o segn le indique el profesional que la asiste.   Tiene una prdida de lquido por la vagina (canal de parto). Si sospecha una ruptura de las membranas, tmese la temperatura y llame al profesional para informarlo sobre esto.   Observa unas pequeas manchas, una hemorragia vaginal o elimina cogulos. Avsele al profesional acerca de la cantidad y de cuntos apsitos est utilizando.   Presenta un olor desagradable en la secrecin vaginal y observa un cambio en el color, de transparente a blanco.   Ha vomitado durante ms de 24 horas.   Presenta escalofros o fiebre.   Comienza a sentir falta de aire.   Siente ardor al orinar.   Baja o sube ms de 900 g (ms de 2 libras), o segn lo indicado por el profesional que la asiste. Observa que sbitamente se le hinchan el rostro, las manos, los pies o las   piernas.   Presenta dolor abdominal. Las molestias en el ligamento redondo son una causa benigna (no cancerosa) frecuente de dolor abdominal durante el embarazo, pero el profesional que la asiste deber evaluarlo.   Presenta dolor de cabeza intenso que no se alivia.   Si no siente los movimientos del beb durante ms de tres horas. Si piensa que el beb no se mueve tanto como lo haca habitualmente, coma algo que contenga azcar y recustese sobre el lado izquierdo durante una hora. El beb debe moverse al menos 4  5 veces por hora. Comunquese inmediatamente si el beb se mueve menos que lo indicado.   Se cae, se ve involucrada en un accidente automovilstico o sufre algn tipo de traumatismo.   En su hogar hay violencia mental o fsica.  Document Released: 11/01/2004 Document Revised: 01/11/2011 [employer] Patient Information 2012 [employer]. 

## 2011-07-25 ENCOUNTER — Ambulatory Visit (INDEPENDENT_AMBULATORY_CARE_PROVIDER_SITE_OTHER): Payer: Self-pay | Admitting: Advanced Practice Midwife

## 2011-07-25 ENCOUNTER — Encounter: Payer: Self-pay | Admitting: Advanced Practice Midwife

## 2011-07-25 VITALS — BP 119/78 | Wt 186.8 lb

## 2011-07-25 DIAGNOSIS — O0930 Supervision of pregnancy with insufficient antenatal care, unspecified trimester: Secondary | ICD-10-CM

## 2011-07-25 DIAGNOSIS — O26843 Uterine size-date discrepancy, third trimester: Secondary | ICD-10-CM

## 2011-07-25 LAB — POCT URINALYSIS DIP (DEVICE)
Leukocytes, UA: NEGATIVE
Nitrite: NEGATIVE
Protein, ur: NEGATIVE mg/dL
Urobilinogen, UA: 0.2 mg/dL (ref 0.0–1.0)
pH: 7 (ref 5.0–8.0)

## 2011-07-25 NOTE — Progress Notes (Signed)
20 y.o. G1 @ 39w 3d per LMP, but 40w 1d by GYN in [place].  Additionally, a late ultrasound on 5/13 out the EDD @ 07/14/11, making her 41w 4d today.  Currently the patient denies contractions, leakage of fluids, and vaginal bleeding. Cervix was not checked since she denied contractions.  Given the discrepancy of dates and concern for LGA fetus, she will return to high risk clinic tomorrow (6/20) for ultrasound, fasting CBG and cervical check in high risk clinic. I have discussed the plan with  Dierdra Poe and Dr. Marice Potter.

## 2011-07-25 NOTE — Progress Notes (Signed)
Pt left clinic prior to being informed of plan of care for tomorrow. Called pt w/[employer] interpreterNadara Eaton [phone]. Pt was informed of need for appts tomorrow for fasting CBG @ 0830, ultrasound @ 0900 and clinic appt after ultrasound to determine plan of care. Pt voiced understanding and agreed to appts tomorrow.

## 2011-07-25 NOTE — Progress Notes (Signed)
Pulse- 86  Edema- feet Vaginal discharge- "clear and now thick"

## 2011-07-26 ENCOUNTER — Ambulatory Visit (HOSPITAL_COMMUNITY)
Admission: RE | Admit: 2011-07-26 | Discharge: 2011-07-26 | Disposition: A | Discharge status: Home / Self Care | Payer: Self-pay | Source: Ambulatory Visit | Attending: Obstetrics and Gynecology | Admitting: Obstetrics and Gynecology

## 2011-07-26 ENCOUNTER — Inpatient Hospital Stay (HOSPITAL_COMMUNITY)
Admission: RE | Admit: 2011-07-26 | Discharge: 2011-07-28 | DRG: 766 | Disposition: A | Discharge status: Home / Self Care | Payer: Medicaid Other | Source: Ambulatory Visit | Attending: Obstetrics & Gynecology | Admitting: Obstetrics & Gynecology

## 2011-07-26 ENCOUNTER — Encounter (HOSPITAL_COMMUNITY): Payer: Self-pay | Admitting: Pharmacy Technician

## 2011-07-26 ENCOUNTER — Encounter: Payer: Self-pay | Admitting: Obstetrics and Gynecology

## 2011-07-26 ENCOUNTER — Encounter (HOSPITAL_COMMUNITY)
Admission: RE | Disposition: A | Discharge status: Home / Self Care | Payer: Self-pay | Source: Ambulatory Visit | Attending: Obstetrics & Gynecology

## 2011-07-26 ENCOUNTER — Encounter (HOSPITAL_COMMUNITY): Payer: Self-pay | Admitting: *Deleted

## 2011-07-26 ENCOUNTER — Encounter (HOSPITAL_COMMUNITY): Payer: Self-pay | Admitting: Anesthesiology

## 2011-07-26 ENCOUNTER — Other Ambulatory Visit: Payer: Self-pay

## 2011-07-26 ENCOUNTER — Inpatient Hospital Stay (HOSPITAL_COMMUNITY): Payer: Medicaid Other | Admitting: Anesthesiology

## 2011-07-26 DIAGNOSIS — O3660X0 Maternal care for excessive fetal growth, unspecified trimester, not applicable or unspecified: Secondary | ICD-10-CM | POA: Insufficient documentation

## 2011-07-26 DIAGNOSIS — O0930 Supervision of pregnancy with insufficient antenatal care, unspecified trimester: Secondary | ICD-10-CM

## 2011-07-26 DIAGNOSIS — O34219 Maternal care for unspecified type scar from previous cesarean delivery: Principal | ICD-10-CM | POA: Diagnosis present

## 2011-07-26 DIAGNOSIS — O26843 Uterine size-date discrepancy, third trimester: Secondary | ICD-10-CM

## 2011-07-26 DIAGNOSIS — O480 Post-term pregnancy: Secondary | ICD-10-CM | POA: Insufficient documentation

## 2011-07-26 LAB — CBC
HCT: 31.9 % — ABNORMAL LOW (ref 36.0–46.0)
Hemoglobin: 10.6 g/dL — ABNORMAL LOW (ref 12.0–15.0)
MCH: 28 pg (ref 26.0–34.0)
MCHC: 33.2 g/dL (ref 30.0–36.0)
MCV: 84.2 fL (ref 78.0–100.0)
Platelets: 251 10*3/uL (ref 150–400)
RBC: 3.79 MIL/uL — ABNORMAL LOW (ref 3.87–5.11)
RDW: 13.7 % (ref 11.5–15.5)
WBC: 8.2 10*3/uL (ref 4.0–10.5)

## 2011-07-26 LAB — SURGICAL PCR SCREEN: MRSA, PCR: NEGATIVE

## 2011-07-26 LAB — GLUCOSE, CAPILLARY: Glucose-Capillary: 84 mg/dL (ref 70–99)

## 2011-07-26 SURGERY — Surgical Case
Anesthesia: Spinal | Site: Abdomen | Wound class: Clean Contaminated

## 2011-07-26 MED ORDER — TETANUS-DIPHTH-ACELL PERTUSSIS 5-2.5-18.5 LF-MCG/0.5 IM SUSP
0.5000 mL | Freq: Once | INTRAMUSCULAR | Status: AC
  Administered 2011-07-28: 0.5 mL via INTRAMUSCULAR
  Filled 2011-07-26: qty 0.5

## 2011-07-26 MED ORDER — FENTANYL CITRATE 0.05 MG/ML IJ SOLN
INTRAMUSCULAR | Status: AC
  Filled 2011-07-26: qty 2.5

## 2011-07-26 MED ORDER — KETOROLAC TROMETHAMINE 30 MG/ML IJ SOLN: 30.0000 mg | Freq: Four times a day (QID) | INTRAMUSCULAR | Status: AC | PRN

## 2011-07-26 MED ORDER — SENNOSIDES-DOCUSATE SODIUM 8.6-50 MG PO TABS
2.0000 | ORAL_TABLET | Freq: Every day | ORAL | Status: DC
  Administered 2011-07-26 – 2011-07-27 (×2): 2 via ORAL

## 2011-07-26 MED ORDER — PHENYLEPHRINE 40 MCG/ML (10ML) SYRINGE FOR IV PUSH (FOR BLOOD PRESSURE SUPPORT)
PREFILLED_SYRINGE | INTRAVENOUS | Status: AC
  Filled 2011-07-26: qty 5

## 2011-07-26 MED ORDER — SCOPOLAMINE 1 MG/3DAYS TD PT72
1.0000 | MEDICATED_PATCH | Freq: Once | TRANSDERMAL | Status: DC
  Administered 2011-07-26: 1.5 mg via TRANSDERMAL

## 2011-07-26 MED ORDER — BUPIVACAINE HCL (PF) 0.5 % IJ SOLN
INTRAMUSCULAR | Status: DC | PRN
  Administered 2011-07-26: 30 mL

## 2011-07-26 MED ORDER — PRENATAL MULTIVITAMIN CH
1.0000 | ORAL_TABLET | Freq: Every day | ORAL | Status: DC
  Administered 2011-07-27: 1 via ORAL
  Filled 2011-07-26: qty 1

## 2011-07-26 MED ORDER — MUPIROCIN 2 % EX OINT
TOPICAL_OINTMENT | Freq: Two times a day (BID) | CUTANEOUS | Status: DC
  Administered 2011-07-26: 14:00:00 via NASAL

## 2011-07-26 MED ORDER — MORPHINE SULFATE 0.5 MG/ML IJ SOLN
INTRAMUSCULAR | Status: AC
  Filled 2011-07-26: qty 10

## 2011-07-26 MED ORDER — LANOLIN HYDROUS EX OINT: 1.0000 "application " | TOPICAL_OINTMENT | CUTANEOUS | Status: DC | PRN

## 2011-07-26 MED ORDER — SCOPOLAMINE 1 MG/3DAYS TD PT72
MEDICATED_PATCH | TRANSDERMAL | Status: AC
  Administered 2011-07-26: 1.5 mg via TRANSDERMAL
  Filled 2011-07-26: qty 1

## 2011-07-26 MED ORDER — NALBUPHINE HCL 10 MG/ML IJ SOLN: 5.0000 mg | INTRAMUSCULAR | Status: DC | PRN

## 2011-07-26 MED ORDER — ZOLPIDEM TARTRATE 5 MG PO TABS: 5.0000 mg | ORAL_TABLET | Freq: Every evening | ORAL | Status: DC | PRN

## 2011-07-26 MED ORDER — MORPHINE SULFATE (PF) 0.5 MG/ML IJ SOLN
INTRAMUSCULAR | Status: DC | PRN
  Administered 2011-07-26: .1 mg via INTRATHECAL

## 2011-07-26 MED ORDER — FENTANYL CITRATE 0.05 MG/ML IJ SOLN: 25.0000 ug | INTRAMUSCULAR | Status: DC | PRN

## 2011-07-26 MED ORDER — DIPHENHYDRAMINE HCL 25 MG PO CAPS: 25.0000 mg | ORAL_CAPSULE | Freq: Four times a day (QID) | ORAL | Status: DC | PRN

## 2011-07-26 MED ORDER — ONDANSETRON HCL 4 MG/2ML IJ SOLN
INTRAMUSCULAR | Status: DC | PRN
  Administered 2011-07-26: 4 mg via INTRAVENOUS

## 2011-07-26 MED ORDER — SCOPOLAMINE 1 MG/3DAYS TD PT72: 1.0000 | MEDICATED_PATCH | Freq: Once | TRANSDERMAL | Status: DC

## 2011-07-26 MED ORDER — CEFAZOLIN SODIUM-DEXTROSE 2-3 GM-% IV SOLR
2.0000 g | INTRAVENOUS | Status: AC
  Administered 2011-07-26: 2 g via INTRAVENOUS
  Filled 2011-07-26: qty 50

## 2011-07-26 MED ORDER — KETOROLAC TROMETHAMINE 60 MG/2ML IM SOLN
60.0000 mg | Freq: Once | INTRAMUSCULAR | Status: AC | PRN
  Administered 2011-07-26: 60 mg via INTRAMUSCULAR

## 2011-07-26 MED ORDER — IBUPROFEN 800 MG PO TABS
800.0000 mg | ORAL_TABLET | Freq: Three times a day (TID) | ORAL | Status: DC
  Administered 2011-07-26 – 2011-07-28 (×5): 800 mg via ORAL
  Filled 2011-07-26 (×5): qty 1

## 2011-07-26 MED ORDER — BUPIVACAINE IN DEXTROSE 0.75-8.25 % IT SOLN
INTRATHECAL | Status: DC | PRN
  Administered 2011-07-26: 1.5 mL via INTRATHECAL

## 2011-07-26 MED ORDER — SODIUM CHLORIDE 0.9 % IJ SOLN: 3.0000 mL | INTRAMUSCULAR | Status: DC | PRN

## 2011-07-26 MED ORDER — MUPIROCIN 2 % EX OINT
TOPICAL_OINTMENT | CUTANEOUS | Status: AC
  Filled 2011-07-26: qty 22

## 2011-07-26 MED ORDER — DIPHENHYDRAMINE HCL 50 MG/ML IJ SOLN: 25.0000 mg | INTRAMUSCULAR | Status: DC | PRN

## 2011-07-26 MED ORDER — MENTHOL 3 MG MT LOZG: 1.0000 | LOZENGE | OROMUCOSAL | Status: DC | PRN

## 2011-07-26 MED ORDER — IBUPROFEN 600 MG PO TABS: 600.0000 mg | ORAL_TABLET | Freq: Four times a day (QID) | ORAL | Status: DC | PRN

## 2011-07-26 MED ORDER — WITCH HAZEL-GLYCERIN EX PADS: 1.0000 "application " | MEDICATED_PAD | CUTANEOUS | Status: DC | PRN

## 2011-07-26 MED ORDER — ONDANSETRON HCL 4 MG/2ML IJ SOLN
INTRAMUSCULAR | Status: AC
  Filled 2011-07-26: qty 2

## 2011-07-26 MED ORDER — ONDANSETRON HCL 4 MG/2ML IJ SOLN: 4.0000 mg | INTRAMUSCULAR | Status: DC | PRN

## 2011-07-26 MED ORDER — DIPHENHYDRAMINE HCL 25 MG PO CAPS: 25.0000 mg | ORAL_CAPSULE | ORAL | Status: DC | PRN

## 2011-07-26 MED ORDER — OXYTOCIN 40 UNITS IN LACTATED RINGERS INFUSION - SIMPLE MED: 62.5000 mL/h | INTRAVENOUS | Status: AC

## 2011-07-26 MED ORDER — DIPHENHYDRAMINE HCL 50 MG/ML IJ SOLN: 12.5000 mg | INTRAMUSCULAR | Status: DC | PRN

## 2011-07-26 MED ORDER — SIMETHICONE 80 MG PO CHEW
80.0000 mg | CHEWABLE_TABLET | Freq: Three times a day (TID) | ORAL | Status: DC
  Administered 2011-07-26 – 2011-07-27 (×5): 80 mg via ORAL

## 2011-07-26 MED ORDER — SODIUM CHLORIDE 0.9 % IV SOLN: 1.0000 ug/kg/h | INTRAVENOUS | Status: DC | PRN

## 2011-07-26 MED ORDER — OXYTOCIN 10 UNIT/ML IJ SOLN
INTRAMUSCULAR | Status: AC
  Filled 2011-07-26: qty 4

## 2011-07-26 MED ORDER — FENTANYL CITRATE 0.05 MG/ML IJ SOLN
INTRAMUSCULAR | Status: DC | PRN
  Administered 2011-07-26: 15 ug via INTRATHECAL

## 2011-07-26 MED ORDER — LACTATED RINGERS IV SOLN
INTRAVENOUS | Status: DC
  Administered 2011-07-27: 02:00:00 via INTRAVENOUS

## 2011-07-26 MED ORDER — OXYTOCIN 10 UNIT/ML IJ SOLN
40.0000 [IU] | INTRAVENOUS | Status: DC | PRN
  Administered 2011-07-26: 40 [IU] via INTRAVENOUS

## 2011-07-26 MED ORDER — MEPERIDINE HCL 25 MG/ML IJ SOLN: 6.2500 mg | INTRAMUSCULAR | Status: DC | PRN

## 2011-07-26 MED ORDER — LACTATED RINGERS IV SOLN
INTRAVENOUS | Status: DC
  Administered 2011-07-26 (×3): via INTRAVENOUS

## 2011-07-26 MED ORDER — ONDANSETRON HCL 4 MG/2ML IJ SOLN: 4.0000 mg | Freq: Three times a day (TID) | INTRAMUSCULAR | Status: DC | PRN

## 2011-07-26 MED ORDER — BUPIVACAINE HCL (PF) 0.5 % IJ SOLN
INTRAMUSCULAR | Status: AC
  Filled 2011-07-26: qty 30

## 2011-07-26 MED ORDER — DIBUCAINE 1 % RE OINT: 1.0000 "application " | TOPICAL_OINTMENT | RECTAL | Status: DC | PRN

## 2011-07-26 MED ORDER — SIMETHICONE 80 MG PO CHEW: 80.0000 mg | CHEWABLE_TABLET | ORAL | Status: DC | PRN

## 2011-07-26 MED ORDER — PHENYLEPHRINE HCL 10 MG/ML IJ SOLN
INTRAMUSCULAR | Status: DC | PRN
  Administered 2011-07-26: 80 ug via INTRAVENOUS

## 2011-07-26 MED ORDER — NALOXONE HCL 0.4 MG/ML IJ SOLN: 0.4000 mg | INTRAMUSCULAR | Status: DC | PRN

## 2011-07-26 MED ORDER — OXYCODONE-ACETAMINOPHEN 5-325 MG PO TABS
1.0000 | ORAL_TABLET | ORAL | Status: DC | PRN
  Administered 2011-07-27 – 2011-07-28 (×3): 1 via ORAL
  Filled 2011-07-26 (×3): qty 1

## 2011-07-26 MED ORDER — ONDANSETRON HCL 4 MG PO TABS
4.0000 mg | ORAL_TABLET | ORAL | Status: DC | PRN
  Administered 2011-07-27: 4 mg via ORAL
  Filled 2011-07-26: qty 1

## 2011-07-26 MED ORDER — KETOROLAC TROMETHAMINE 60 MG/2ML IM SOLN
INTRAMUSCULAR | Status: AC
  Administered 2011-07-26: 60 mg via INTRAMUSCULAR
  Filled 2011-07-26: qty 2

## 2011-07-26 MED ORDER — METOCLOPRAMIDE HCL 5 MG/ML IJ SOLN: 10.0000 mg | Freq: Three times a day (TID) | INTRAMUSCULAR | Status: DC | PRN

## 2011-07-26 SURGICAL SUPPLY — 38 items
BARRIER ADHS 3X4 INTERCEED (GAUZE/BANDAGES/DRESSINGS) IMPLANT
CHLORAPREP W/TINT 26ML (MISCELLANEOUS) ×2 IMPLANT
CLOTH BEACON ORANGE TIMEOUT ST (SAFETY) ×2 IMPLANT
CONTAINER PREFILL 10% NBF 15ML (MISCELLANEOUS) IMPLANT
DRESSING TELFA 8X3 (GAUZE/BANDAGES/DRESSINGS) ×2 IMPLANT
DRSG COVADERM 4X10 (GAUZE/BANDAGES/DRESSINGS) ×2 IMPLANT
ELECT REM PT RETURN 9FT ADLT (ELECTROSURGICAL) ×2
ELECTRODE REM PT RTRN 9FT ADLT (ELECTROSURGICAL) ×1 IMPLANT
GAUZE SPONGE 4X4 12PLY STRL LF (GAUZE/BANDAGES/DRESSINGS) IMPLANT
GLOVE BIO SURGEON STRL SZ 6.5 (GLOVE) ×4 IMPLANT
GLOVE BIOGEL PI IND STRL 7.0 (GLOVE) ×2 IMPLANT
GLOVE BIOGEL PI INDICATOR 7.0 (GLOVE) ×2
GLOVE NEODERM STER SZ 7 (GLOVE) ×4 IMPLANT
GOWN PREVENTION PLUS LG XLONG (DISPOSABLE) ×6 IMPLANT
KIT ABG SYR 3ML LUER SLIP (SYRINGE) IMPLANT
NEEDLE HYPO 25X5/8 SAFETYGLIDE (NEEDLE) IMPLANT
NEEDLE SPNL 18GX3.5 QUINCKE PK (NEEDLE) ×2 IMPLANT
NS IRRIG 1000ML POUR BTL (IV SOLUTION) ×2 IMPLANT
PACK C SECTION WH (CUSTOM PROCEDURE TRAY) ×2 IMPLANT
PAD ABD 7.5X8 STRL (GAUZE/BANDAGES/DRESSINGS) IMPLANT
PAD OB MATERNITY 4.3X12.25 (PERSONAL CARE ITEMS) ×2 IMPLANT
SLEEVE SCD COMPRESS KNEE MED (MISCELLANEOUS) IMPLANT
STRIP CLOSURE SKIN 1/2X4 (GAUZE/BANDAGES/DRESSINGS) ×2 IMPLANT
SUT PDS AB 0 CTX 60 (SUTURE) IMPLANT
SUT VIC AB 0 CT1 27 (SUTURE)
SUT VIC AB 0 CT1 27XBRD ANBCTR (SUTURE) IMPLANT
SUT VIC AB 0 CT1 36 (SUTURE) IMPLANT
SUT VIC AB 2-0 CT1 27 (SUTURE) ×1
SUT VIC AB 2-0 CT1 TAPERPNT 27 (SUTURE) ×1 IMPLANT
SUT VIC AB 2-0 CTX 36 (SUTURE) ×4 IMPLANT
SUT VIC AB 3-0 CT1 27 (SUTURE) ×1
SUT VIC AB 3-0 CT1 TAPERPNT 27 (SUTURE) ×1 IMPLANT
SUT VIC AB 3-0 SH 27 (SUTURE)
SUT VIC AB 3-0 SH 27X BRD (SUTURE) IMPLANT
SYR 30ML LL (SYRINGE) ×2 IMPLANT
TOWEL OR 17X24 6PK STRL BLUE (TOWEL DISPOSABLE) ×4 IMPLANT
TRAY FOLEY CATH 14FR (SET/KITS/TRAYS/PACK) ×2 IMPLANT
WATER STERILE IRR 1000ML POUR (IV SOLUTION) ×2 IMPLANT

## 2011-07-26 NOTE — Anesthesia Procedure Notes (Signed)
Spinal  Patient location during procedure: OR Start time: 07/26/2011 2:35 PM Staffing Performed by: anesthesiologist  Preanesthetic Checklist Completed: patient identified, site marked, surgical consent, pre-op evaluation, timeout performed, IV checked, risks and benefits discussed and monitors and equipment checked Spinal Block Patient position: sitting Prep: site prepped and draped and DuraPrep Patient monitoring: continuous pulse ox, blood pressure and heart rate Approach: midline Location: L3-4 Injection technique: single-shot Needle Needle type: Pencan  Needle gauge: 24 G Needle length: 9 cm Assessment Sensory level: T4 Additional Notes Clear free flow CSF on first attempt.  No paresthesia.  Patient tolerated procedure well.  Jasmine December, MD

## 2011-07-26 NOTE — H&P (Signed)
Krista Soto is a 21 y.o. female presenting for elective c-section for > 4.5 kg infant by ultrasound at [redacted] weeks gestation (uncertain dates). Marland Kitchen History  [name] who previously received prenatal care in [place] and then transferred here when she moved.  She denies any history of diabetes, hypertension either previously or during this pregnancy.  She was never told of any abnormal ultrasounds while under care in [place].  OB History    Grav Para Term Preterm Abortions TAB SAB Ect Mult Living   1              Past Medical History  Diagnosis Date  . No pertinent past medical history    Past Surgical History  Procedure Date  . No past surgeries    Family History: family history includes Diabetes in her maternal grandmother and paternal grandmother. Social History:  reports that she has never smoked. She has never used smokeless tobacco. She reports that she does not drink alcohol or use illicit drugs.   Prenatal Transfer Tool  Maternal Diabetes: No Genetic Screening: Declined Maternal Ultrasounds/Referrals: Abnormal:  Findings:   Other: Please see prenatal record for details Fetal Ultrasounds or other Referrals:  None Maternal Substance Abuse:  No Significant Maternal Medications:  None Significant Maternal Lab Results:  None Other Comments:  None  Review of Systems  Constitutional: Negative for fever, chills and diaphoresis.  Respiratory: Negative for shortness of breath.   Cardiovascular: Negative for chest pain.  Neurological: Negative for dizziness, sensory change, focal weakness, loss of consciousness and headaches.   Other pertinent ROS negative.   Blood pressure 119/83, pulse 100, temperature 98.2 F (36.8 C), temperature source Oral, resp. rate 18, height 5' 4.5" (1.638 m), last menstrual period 10/22/2010, SpO2 100.00%. Exam Physical Exam  Constitutional: She is oriented to person, place, and time. She appears well-developed and  well-nourished.  HENT:  Head: Normocephalic and atraumatic.  Eyes: Conjunctivae are normal. Right eye exhibits no discharge. Left eye exhibits no discharge. No scleral icterus.  Neck: No tracheal deviation present.  Cardiovascular: Normal rate, regular rhythm and normal heart sounds.   Respiratory: Effort normal and breath sounds normal. No stridor. No respiratory distress.  GI: Soft. She exhibits no distension. There is no tenderness. There is no rebound and no guarding.       Gravid  Neurological: She is alert and oriented to person, place, and time.  Skin: Skin is warm and dry.  Psychiatric: She has a normal mood and affect. Her behavior is normal. Judgment and thought content normal.    Prenatal labs: ABO, Rh: A/POS/-- (04/24 1204) Antibody: NEG (04/24 1204) Rubella: 189.4 (04/24 1204) RPR: NON REAC (04/24 1204)  HBsAg: NEGATIVE (04/24 1204)  HIV: NON REACTIVE (04/24 1204)  GBS:     Assessment/Plan: For elective c-section for macrosomia at term.   Clancy Gourd 07/26/2011, 1:47 PM History and physical performed with Spanish translator at bedside.

## 2011-07-26 NOTE — Transfer of Care (Signed)
Immediate Anesthesia Transfer of Care Note  Patient: Krista Soto  Procedure(s) Performed: Procedure(s) (LRB): CESAREAN SECTION (N/A)  Patient Location: PACU  Anesthesia Type: Spinal  Level of Consciousness: awake  Airway & Oxygen Therapy: Patient Spontanous Breathing  Post-op Assessment: Report given to PACU RN and Post -op Vital signs reviewed and stable  Post vital signs: stable  Complications: No apparent anesthesia complications

## 2011-07-26 NOTE — Anesthesia Postprocedure Evaluation (Signed)
Anesthesia Post Note  Patient: Krista Soto  Procedure(s) Performed: Procedure(s) (LRB): CESAREAN SECTION (N/A)  Anesthesia type: Spinal  Patient location: PACU  Post pain: Pain level controlled  Post assessment: Post-op Vital signs reviewed  Last Vitals:  Filed Vitals:   07/26/11 1630  BP: 130/84  Pulse: 64  Temp: 36.9 C  Resp: 18    Post vital signs: Reviewed  Level of consciousness: awake  Complications: No apparent anesthesia complications

## 2011-07-26 NOTE — Consult Note (Signed)
Neonatology Note:   Attendance at C-section:    I was asked to attend this primary C/S at term. The mother is a G1P0 A pos, GBS not on chart, with late PNC (rceived some care in [place]) macrosomia. ROM at delivery, fluid clear. Infant vigorous with good spontaneous cry and tone. Needed only minimal bulb suctioning. Ap 9/9. Lungs clear to ausc in DR. To CN to care of Pediatrician.   Deatra James, MD

## 2011-07-26 NOTE — Op Note (Signed)
07/26/2011  3:13 PM  PATIENT:  Krista Soto  21 y.o. female  PRE-OPERATIVE DIAGNOSIS:  Macrosomia, 42 weeks POST-OPERATIVE DIAGNOSIS:  Macrosomia, 42 weeks  PROCEDURE:  Procedure(s) (LRB): CESAREAN SECTION (N/A)  SURGEON:  Surgeon(s) and Role:    * Allie Bossier, MD - Primary  PHYSICIAN ASSISTANT:   ASSISTANTS: Clancy Gourd, MD   ANESTHESIA:   spinal  FINDINGS: living female infant with 53 & 9 for Apgars, normal pelvic anatomy, intact placenta with 3 vessel cord  EBL:  Total I/O In: 2000 [I.V.:2000] Out: 1200 [Urine:200; Blood:1000]  BLOOD ADMINISTERED:none  DRAINS: none   LOCAL MEDICATIONS USED:  MARCAINE     SPECIMEN:  Source of Specimen:  cord blood  DISPOSITION OF SPECIMEN:  PATHOLOGY  COUNTS:  YES  TOURNIQUET:  * No tourniquets in log *  DICTATION: .Dragon Dictation  PLAN OF CARE: Admit to inpatient   PATIENT DISPOSITION:  PACU - hemodynamically stable.   Delay start of Pharmacological VTE agent (>24hrs) due to surgical blood loss or risk of bleeding: not applicable          Detailed procedure of findings:  The risks benefits and alternatives of surgery were explained, understood, and accepted. Consents were signed. All questions were answered. In the operating room spinal anesthesia was given without complications. Her abdomen and vagina were prepped and draped in the usual sterile fashion. A Foley catheter was placed, which drained clear urine throughout the case. After adequate anesthesia was assured, a transverse incision was made about 2 cm above the symphysis pubis. The incision was carried down to the fascia. Fascia was scored the midline and fascial incision was extended bilaterally. The middle 50% of the rectus muscles were separated in transverse fashion using electrosurgical technique. Excellent hemostasis was maintained. The peritoneum was entered with hemostats. The peritoneal excision incision was extended bilaterally and curved slightly  upwards with the Bovie, taking care to avoid the bladder. A bladder blade was placed. The lower uterine segment was relatively thin.  I entered the uterine cavity with a hemostat. A large amount of clear fluid was noted. The baby was delivered from a vertex presentation.Its mouth and nostrils were suctioned. The baby was transferred to the NICU personnel for routine care. Its Apgars are listed above. A cord blood sample was obtained the placenta was extracted using traction. The uterus was left in situ. The uterine interior was cleaned with a dry lap sponge. The uterine incision was closed with 2 layers of 2-0 Vicryl suture in a running locking fashion, the second layer imbricating the first. Excellent hemostasis was noted. By tilting the uterus to either side, I was able to visualize the adnexa. They both were normal. The rectus muscles and rectus fascia were both noted to be hemostatic. The fascia was closed with a 0 vicryl suture in a running nonlocking fashion. No defects were palpable. Please note, that prior to making my initial incisions, I injected 30 mL of half percent bupivacaine into the subcutaneous tissue at the site of the proposed incision. I closed the subdermal tissue with a 3-0 Vicryl in a running nonlocking fashion. The instrument, sponge, and needle counts  were correct. She tolerated the procedure well. Her Foley catheter drained clear throughout. She was taken to recovery in stable condition.

## 2011-07-26 NOTE — Anesthesia Preprocedure Evaluation (Signed)
Anesthesia Evaluation  Patient identified by MRN, date of birth, ID band Patient awake    Reviewed: Allergy & Precautions, H&P , NPO status , Patient's Chart, lab work & pertinent test results, reviewed documented beta blocker date and time   History of Anesthesia Complications Negative for: history of anesthetic complications  Airway Mallampati: II TM Distance: >3 FB Neck ROM: full    Dental  (+) Teeth Intact   Pulmonary neg pulmonary ROS,  breath sounds clear to auscultation        Cardiovascular negative cardio ROS  Rhythm:regular Rate:Normal     Neuro/Psych negative neurological ROS  negative psych ROS   GI/Hepatic negative GI ROS, Neg liver ROS,   Endo/Other  negative endocrine ROS  Renal/GU negative Renal ROS  negative genitourinary   Musculoskeletal   Abdominal   Peds  Hematology negative hematology ROS (+)   Anesthesia Other Findings   Reproductive/Obstetrics (+) Pregnancy (macrosomia)                           Anesthesia Physical Anesthesia Plan  ASA: II  Anesthesia Plan: Spinal   Post-op Pain Management:    Induction:   Airway Management Planned:   Additional Equipment:   Intra-op Plan:   Post-operative Plan:   Informed Consent: I have reviewed the patients History and Physical, chart, labs and discussed the procedure including the risks, benefits and alternatives for the proposed anesthesia with the patient or authorized representative who has indicated his/her understanding and acceptance.     Plan Discussed with: Surgeon and CRNA  Anesthesia Plan Comments:         Anesthesia Quick Evaluation

## 2011-07-27 ENCOUNTER — Encounter (HOSPITAL_COMMUNITY): Payer: Self-pay | Admitting: Obstetrics & Gynecology

## 2011-07-27 LAB — CBC
Hemoglobin: 7.9 g/dL — ABNORMAL LOW (ref 12.0–15.0)
MCH: 28.3 pg (ref 26.0–34.0)
RBC: 2.79 MIL/uL — ABNORMAL LOW (ref 3.87–5.11)

## 2011-07-27 NOTE — Anesthesia Postprocedure Evaluation (Signed)
  Anesthesia Post-op Note  Patient: Krista Soto  Procedure(s) Performed: Procedure(s) (LRB): CESAREAN SECTION (N/A)  Patient Location: Mother/Baby  Anesthesia Type: Spinal  Level of Consciousness: awake  Airway and Oxygen Therapy: Patient Spontanous Breathing  Post-op Pain: none  Post-op Assessment: Patient's Cardiovascular Status Stable, Respiratory Function Stable, Patent Airway, No signs of Nausea or vomiting, Adequate PO intake, Pain level controlled, No headache, No backache, No residual numbness and No residual motor weakness  Post-op Vital Signs: Reviewed and stable  Complications: No apparent anesthesia complications

## 2011-07-27 NOTE — Plan of Care (Signed)
Problem: Discharge Progression Outcomes Goal: Remove staples per MD order Outcome: Not Applicable Date Met:  07/27/11 Steri strips and internal sutures

## 2011-07-27 NOTE — Progress Notes (Signed)
Will supplement with oral iron for anemic state.   Kayela Humphres E. 10:49 AM

## 2011-07-27 NOTE — Progress Notes (Signed)
Subjective: Postpartum Day 1: Cesarean Delivery Patient reports incisional pain, tolerating PO and no problems voiding.  Denies chest pain, dyspnea.  Objective: Vital signs in last 24 hours: Temp:  [97.4 F (36.3 C)-99 F (37.2 C)] 98.4 F (36.9 C) (06/21 0606) Pulse Rate:  [64-100] 73  (06/21 0606) Resp:  [16-28] 20  (06/21 0606) BP: (100-138)/(57-92) 106/57 mmHg (06/21 0606) SpO2:  [96 %-100 %] 98 % (06/21 0606) Weight:  [85.775 kg (189 lb 1.6 oz)] 85.775 kg (189 lb 1.6 oz) (06/20 1700)  Physical Exam:  General: alert, cooperative, appears stated age and no distress Lochia: appropriate Uterine Fundus: firm Incision: no dehiscence, no significant erythema DVT Evaluation: No evidence of DVT seen on physical exam.   Basename 07/27/11 0540 07/26/11 1326  HGB 7.9* 10.6*  HCT 23.6* 31.9*    Assessment/Plan: Status post Cesarean section. Doing well postoperatively.  Continue current care.  Clancy Gourd 07/27/2011, 9:04 AM

## 2011-07-27 NOTE — Addendum Note (Signed)
Addendum  created 07/27/11 0935 by Suella Grove, CRNA   Modules edited:Notes Section

## 2011-07-27 NOTE — Progress Notes (Signed)
UR chart review completed.  

## 2011-07-27 NOTE — Progress Notes (Signed)
SW met with MOB with the assistance of [employer] Phone Interpreter to assess for LPNC.  MOB states she received PNC in [place] before moving here and being seen by [facility]. 

## 2011-07-28 MED ORDER — OXYCODONE-ACETAMINOPHEN 5-325 MG PO TABS: 1.0000 | ORAL_TABLET | ORAL | Status: AC | PRN

## 2011-07-28 MED ORDER — IBUPROFEN 800 MG PO TABS: 800.0000 mg | ORAL_TABLET | Freq: Three times a day (TID) | ORAL | Status: AC

## 2011-07-28 NOTE — Discharge Summary (Signed)
Obstetric Discharge Summary Reason for Admission: cesarean section, elective for previous c/s and macrosomia Prenatal Procedures: NST Intrapartum Procedures: cesarean: low cervical, transverse Postpartum Procedures: none Complications-Operative and Postpartum: none Hemoglobin  Date Value Range Status  07/27/2011 7.9* 12.0 - 15.0 g/dL Final     DELTA CHECK NOTED     REPEATED TO VERIFY     HCT  Date Value Range Status  07/27/2011 23.6* 36.0 - 46.0 % Final  Hospital Course: This patient was admitted for elective C/S and did well in surgery. She was taken to recovery then [facility] where she was given routine care. Today she was doing well, incision was clean and intact. She requested early discharge and was discharged.  Physical Exam:  General: alert and no distress Lochia: appropriate Uterine Fundus: firm Incision: healing well, small area of dried blood on left DVT Evaluation: No evidence of DVT seen on physical exam.  Discharge Diagnoses: Term Pregnancy-delivered  Discharge Information: Date: 07/28/2011 Activity: unrestricted and pelvic rest Diet: routine Medications: Ibuprofen and Percocet Condition: stable Instructions: refer to practice specific booklet Discharge to: home Follow-up Information    [facility] in 6 weeks.         Newborn Data: Live born female  Birth Weight: 9 lb 4.3 oz (4205 g) APGAR: 9, 9  Home with mother.  [facility] 07/28/2011, 11:31 AM

## 2011-07-28 NOTE — Discharge Instructions (Signed)
Parto por cesárea, Cuidados posteriores  °(Cesarean Delivery, Care After) °Siga estas instrucciones durante las próximas semanas. Estas indicaciones le proporcionan información general acerca de cómo deberá cuidarse después del procedimiento. El médico también podrá darle instrucciones específicas. El tratamiento ha sido planificado según las prácticas médicas actuales, pero en algunos casos pueden ocurrir problemas. Comuníquese con el médico si tiene algún problema o tiene preguntas después del procedimiento.  °INSTRUCCIONES PARA EL CUIDADO EN EL HOGAR  °La curación puede demorar algún tiempo. Puede sentir molestias, sensibilidad, hinchazón y hematomas en el sitio de la operación, durante algunas semanas. Esto es normal y mejorará a medida que pase el tiempo.  °Actividad °· Al volver a su casa, durante las 2 primeras semanas descanse siempre que pueda.  °· Siempre que le sea posible, solicite ayuda para realizar las actividades domésticas y para el cuidado del bebé, durante 2 ó 3 semanas.  °· Limite las tareas domésticas y la actividad social. Aumente gradualmente su actividad a medida que recupera la fuerza.  °· No suba escaleras más de 2 o 3 veces por día.  °· No levante nada que sea más pesado que su bebé.  °· Siga las instrucciones de su médico con respecto a conducir automóviles.  °· Consulte con su médico si puede practicar ejercicios.  °Nutrición °· Puede volver a su dieta habitual. Consuma una dieta normal y bien balanceada.  °· Debe ingerir gran cantidad de líquido para mantener la orina de tono claro o color amarillo pálido.  °· Siga tomando los suplementos para el período prenatal o el complejo multivitamínico.  °· No beba alcohol hasta que el médico la autorice.  °Evacuación °Debe retornar a su función intestinal habitual. Si está constipada, consulte a su médico para tomar un laxante suave que la ayudará a ir al baño. Los líquidos y los alimentos que contengan salvado la ayudarán para el problema de la  constipación. Gradualmente agregue frutas, verduras y salvado a su dieta. °Higiene °· Puede darse una ducha, lavarse el cabello y usar la bañera, excepto que su médico le indique otra cosa.  °· Continúe con los cuidados perineales hasta que la secreción vaginal se detenga.  °· No se haga duchas vaginales ni use tampones hasta que el médico la autorice.  °Fiebre °Si se siente afiebrada o tiene escalofríos, tómese la temperatura. La fiebre puede indicar que hay una infección. Las infecciones se tratan con medicamentos.  °Control del Dolor °· Tome sólo medicamentos de venta libre o prescriptos, según las indicaciones del médico. No tome aspirina. Puede ocasionar hemorragias.  °· No conduzca mientras toma analgésicos.  °· Consulte a su médico para volver a tomar o ajustar las dosis de sus medicamentos habituales.  °Cuidados de la Incisión °· Limpie la herida (incisión) suavemente con jabón y agua.  °· Si el médico la autoriza, deje la herida sin vendaje, excepto que observe supuración o irritación.  °· Si tiene pequeñas tiras adhesivas que cruzan la incisión y no se caen dentro de los 7 días, retírelas suavemente.  °· Controle diariamente la incisión y observe si aumenta el enrojecimiento, supura, se hincha o se separa la piel.  °· Abrace una almohada cuando tosa o estornude. Esto ayuda a aliviar el dolor.  °Cuidados Vaginales °La secreción vaginal o la hemorragia pueden durar hasta 6 semanas. Si esta secreción se torna de color rojo brillante, presenta un olor fétido, es demasiado abundante, contiene coágulos o si siente ardor al orinar o debe hacerlo con mucha frecuencia, llame al profesional que la   asiste. Si la hemorragia disminuye y luego se hace más intensa, es su organismo que le dice que debe disminuir la actividad y relajarse más. °Relaciones Sexuales °· Consulte a su médico antes de reanudar la actividad sexual. Generalmente, luego de 4 a 6 semanas, si se siente bien y descansada, podrá reanudar la actividad  sexual. Evite las posiciones que fuercen el sitio de la incisión.  °· Puede quedar embarazada antes de tener el período. Si reanuda las relaciones sexuales, debe utilizar algún método anticonceptivo si no quiere quedar embarazada enseguida.  °Prácticas Saludables °· Cumpla con todas las visitas de control, según le indique su médico. Generalmente el médico indicará que vuelva en 2 ó 3 semanas.  °· Continúe con los exámenes pélvicos anuales.  °· Continúe con el autoexamen de mamas mensual y los exámenes anuales que incluyan un [place].  °Cuidado de las Mamas °· Si no está amamantando, y sus mamas se tornan sensibles, se endurecen o la leche gotea, puede usar un sostén que le ajuste firmemente y aplicar hielo.  °· Si está amamantando, use un buen sostén.  °· Comuníquese con el profesional que la asiste si siente dolor en las mamas, síntomas similares a una gripe, fiebre o endurecimiento y enrojecimiento de las mamas.  °Depresión Posparto °Después del entusiasmo por la llegada del bebé, generalmente puede suceder un período de abatimiento o depresión. Comente sus sensaciones con su pareja, familiares y amigos. Puede estar originado en la modificación de los niveles de hormonas en el organismo. Si esto la preocupa, puede contactarse con el profesional que la asiste. °MISCELÁNEAS °· Limite el uso de bombachas de sostén o medias panty.  °· Si amamanta, puede ser que no tenga su período menstrual por algunos meses o más. Esto es normal en una mujer que amamanta. Si no menstrúa dentro de las 6 semanas posteriores a la interrupción de la lactancia, consulte a su médico.  °· Si no está amamantando, debe esperar que comience a menstruar dentro de las 6 a 12 semanas posteriores al parto. Si no ha comenzado para la 11 semana, consulte a su médico.  °SOLICITE ATENCIÓN MÉDICA SI: °· Presenta enrojecimiento, hinchazón o aumento del dolor en la herida.  °· Aparece pus en la herida.  °· Advierte un olor fétido que proviene de la  herida o del vendaje.  °· La zona de la inyección intravenosa se hincha, se inflama o duele.  °· La herida se abre (los bordes no están unidos).  °· Se siente mareada o sufre un desmayo.  °· Siente dolor al orinar u orina con sangre.  °· Presenta diarrea.  °· Presenta náuseas o vómitos.  °· Observa una secreción vaginal anormal.  °· Aparece una erupción cutánea.  °· Sufre algún tipo de reacción anormal o alérgica debido a los medicamentos que toma.  °· El dolor no se alivia con los medicamentos o empeora.  °· Su temperatura es de 101° F (38.3° C), o es 100.4° F (38° C) tomada 2 veces en un período de 4 horas.  °SOLICITE ATENCIÓN MÉDICA DE INMEDIATO SI: °· La temperatura se eleva por encima de 102° F (38.9° C).  °· Siente dolor abdominal.  °· Siente dolor en el pecho.  °· Le falta el aire.  °· Se desmaya.  °· Presenta dolor, enrojecimiento o hinchazón en las piernas.  °· Sufre una hemorragia intensa con o sin coágulos de sangre.  °Document Released: 01/22/2005 Document Revised: 01/11/2011 °[employer]® Patient Information ©2012 [employer]. °

## 2011-07-30 ENCOUNTER — Encounter: Payer: Self-pay | Admitting: Advanced Practice Midwife

## 2011-07-31 NOTE — Discharge Summary (Signed)
Chart reviewed and agree with management and plan.  

## 2011-08-01 ENCOUNTER — Encounter: Payer: Self-pay | Admitting: Physician Assistant

## 2011-08-02 ENCOUNTER — Encounter: Payer: Self-pay | Admitting: Obstetrics and Gynecology

## 2011-08-30 ENCOUNTER — Ambulatory Visit: Payer: Self-pay | Admitting: Advanced Practice Midwife

## 2013-07-16 IMAGING — US US OB FOLLOW-UP
1 of 2 series · 12 of 28 positions shown · non-contrast
Comparison: none

[Series 1: us ob follow up · 35 acquisitions, 12 frames shown]
[im 2/35]
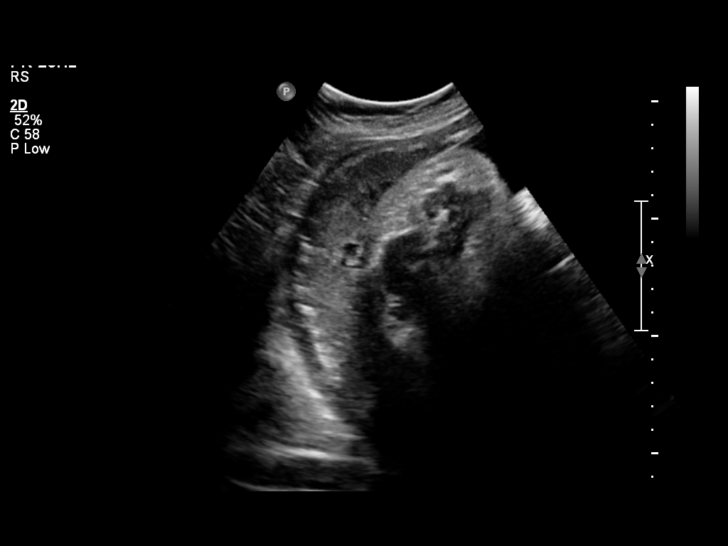
[im 4/35]
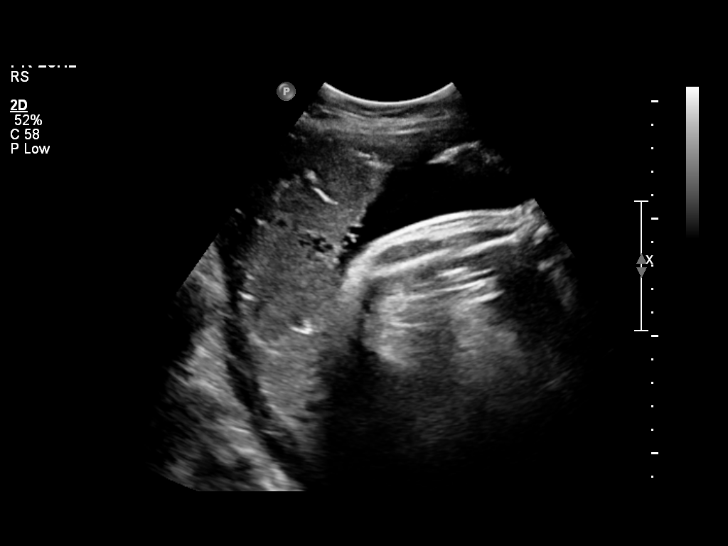
[im 7/35]
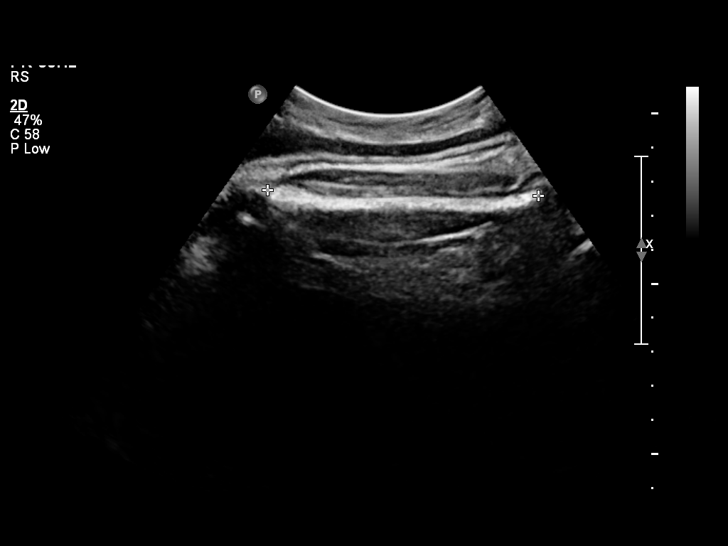
[im 11/35]
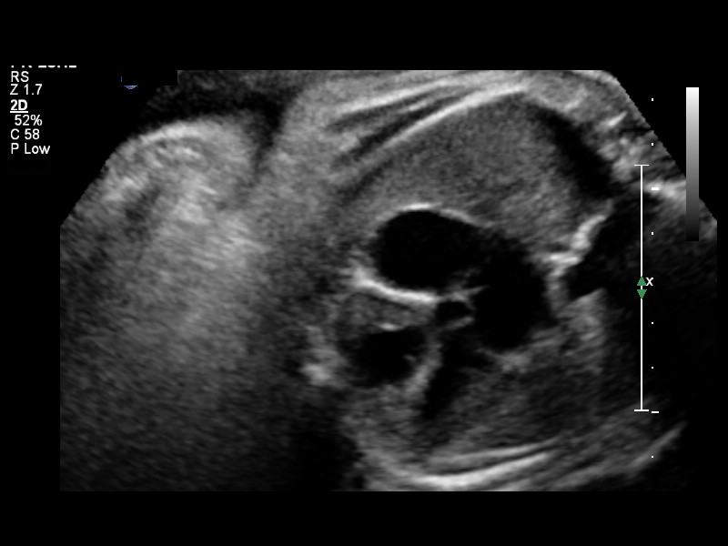
[im 14/35]
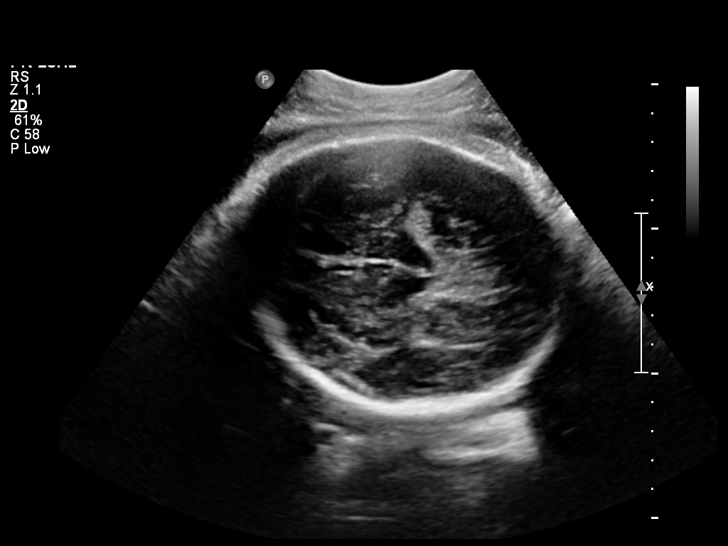
[im 16/35]
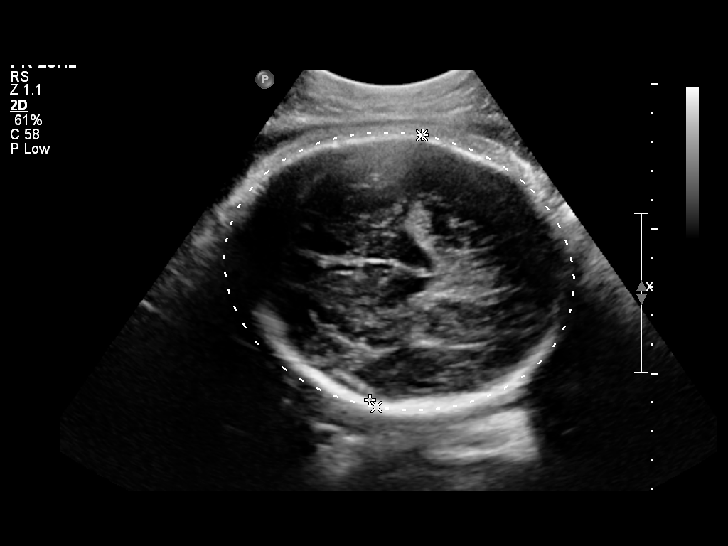
[im 20/35]
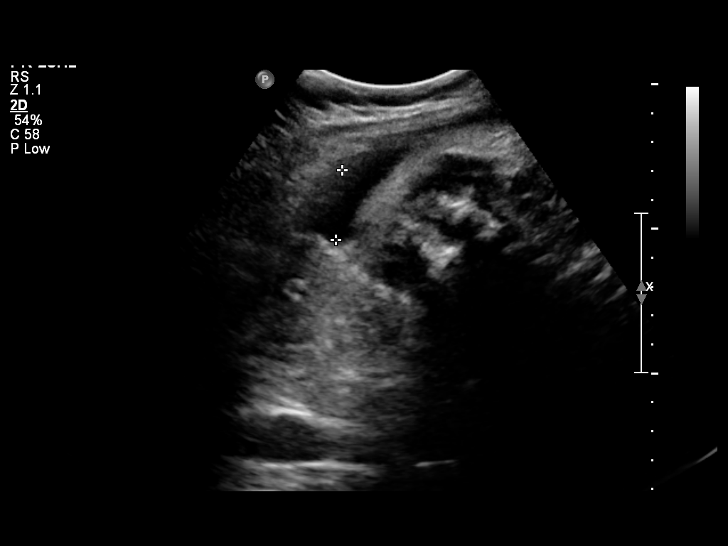
[im 23/35]
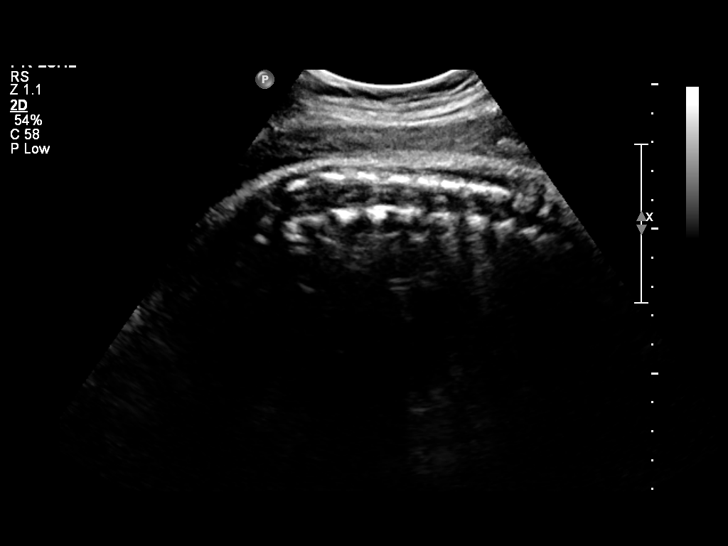
[im 25/35]
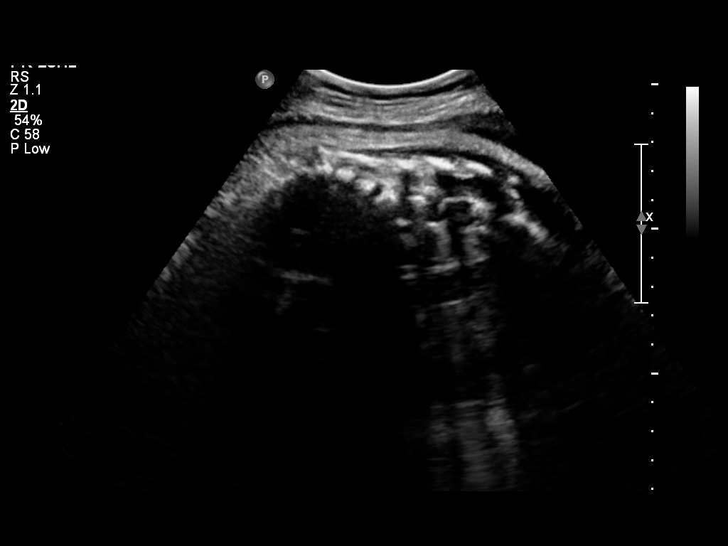
[im 29/35]
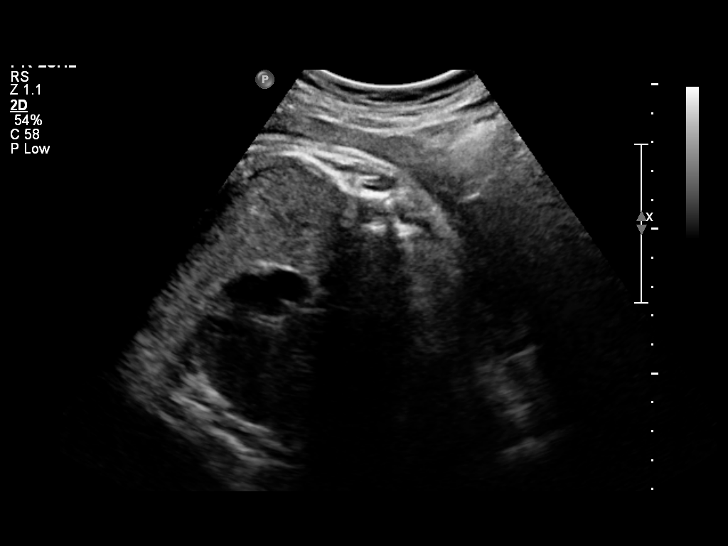
[im 32/35]
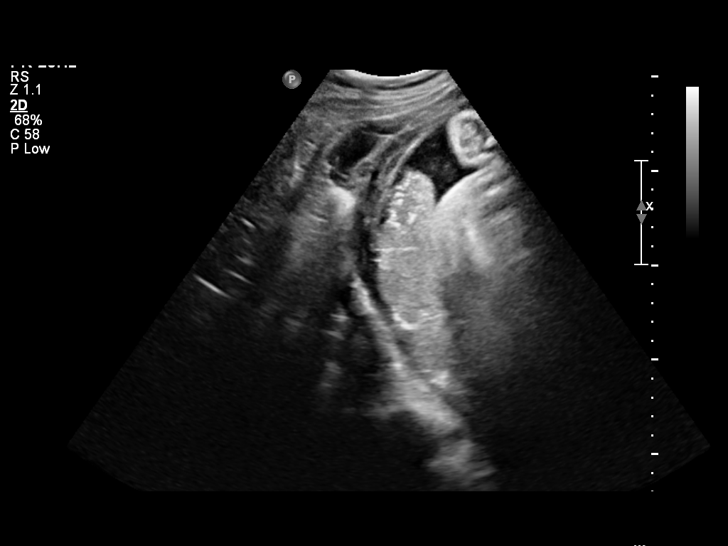
[im 35/35]
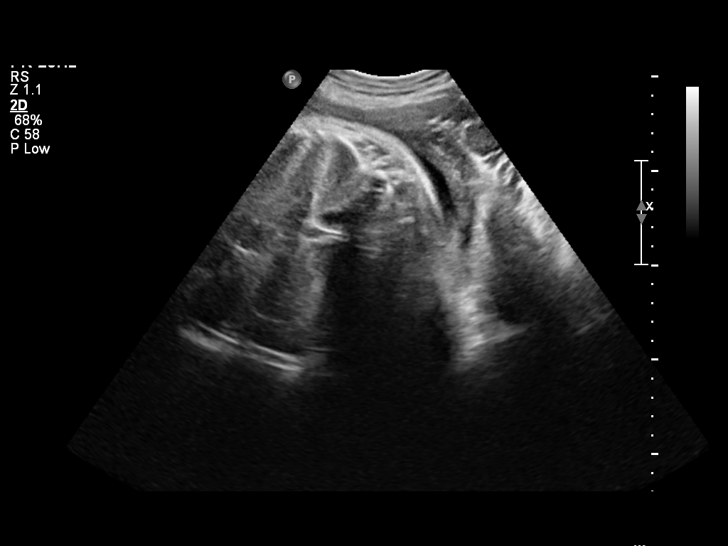

[12 of 28 positions shown; findings below may reference images not displayed]

OBSTETRICS REPORT
                      (Signed Final 07/26/2011 [DATE])

 Order#:         [id number]
Procedures

 US OB FOLLOW UP                                       76816.1
Indications

 Size greater than dates (Large for gestational [AGE]
 Postdate pregnancy (40-42 weeks)
Fetal Evaluation

 Fetal Heart Rate:  133                         bpm
 Cardiac Activity:  Observed
 Presentation:      Cephalic
 Placenta:          Posterior, above cervical
                    os

 Amniotic Fluid
 AFI FV:      Subjectively within normal limits
 AFI Sum:     16.2    cm      83   %Tile     Larg Pckt:   6.67   cm
 RUQ:   6.67   cm    RLQ:    2.41   cm    LUQ:   1.4     cm   LLQ:    5.72   cm
Biometry

 BPD:     93.3  mm    G. Age:   38w 0d                CI:        74.88   70 - 86
                                                      FL/HC:      23.2   20.1 -

 HC:     342.1  mm    G. Age:   39w 3d                HC/AC:      0.86   0.93 -

 AC:     398.6  mm    G. Age:   N/A                   FL/BPD:
 FL:      79.2  mm    G. Age:   40w 4d                FL/AC:      19.9   20 - 24

 Est. FW:    2176  gm    9 lb 15 oz    > 90  %
Gestational Age

 U/S Today:     39w 2d                                        EDD:   07/31/11
 Best:          41w 5d    Det. By:   U/S (06/18/11)           EDD:   07/14/11
Anatomy

 Cranium:           Appears normal      Aortic Arch:       Not well
                                                           visualized
 Fetal Cavum:       Appears normal      Ductal Arch:       Not well
                                                           visualized
 Ventricles:        Appears normal      Diaphragm:         Previously seen
 Choroid Plexus:    Previously seen     Stomach:           Appears
                                                           normal, left
                                                           sided
 Cerebellum:        Not well            Abdomen:           Appears normal
                    visualized
 Posterior Fossa:   Not well            Abdominal Wall:    Not well
                    visualized                             visualized
 Nuchal Fold:       Not applicable      Cord Vessels:      Previously seen
                    (>20 wks GA)
 Face:              Previously seen     Kidneys:           Appear normal
 Heart:             Appears normal      Bladder:           Appears normal
                    (4 chamber &
                    axis)
 RVOT:              Previously seen     Spine:             Appears normal
 LVOT:              Not well            Limbs:             Previously seen
                    visualized

 Other:     Technically difficult due to advanced GA and fetal
            position.
Cervix Uterus Adnexa

 Cervix:       Not visualized (advanced GA >34 wks)

 Adnexa:     No abnormality visualized.
Comments

 EFW range (+ / - 15%) =  6238 - 2404 grams.

 Because of today's EFW, this report was called to Dr. Gammon.
Impression

 Single intrauterine gestation demonstrating an estimated
 gestational age by ultrasound of 39w 2d. This is correlated
 with expected estimated gestational age by  prior ultrasound
 of 41w 5d. The AC is larger than the remaining gestational
 indicators resulting in an EFW > 90% and compatible with a
 LGA fetus.  With an EFW range exceeding 0233 grams, the
 possibility of underlying fetal macrosomia would need to be
 raised.

 No late developing fetal anatomic abnormalities are noted
 associated with the lateral ventricles, four chamber heart,
 stomach, kidneys or bladder.

 Subjectively and quantitatively normal amniotic fluid volume.

## 2013-12-07 ENCOUNTER — Encounter (HOSPITAL_COMMUNITY): Payer: Self-pay | Admitting: Obstetrics & Gynecology

## 2014-08-03 ENCOUNTER — Ambulatory Visit (INDEPENDENT_AMBULATORY_CARE_PROVIDER_SITE_OTHER): Payer: Medicaid Other | Admitting: Family Medicine

## 2014-08-03 ENCOUNTER — Encounter: Payer: Self-pay | Admitting: Family Medicine

## 2014-08-03 VITALS — BP 103/57 | HR 80 | Temp 97.5°F | Resp 16 | Ht 65.0 in | Wt 180.0 lb

## 2014-08-03 DIAGNOSIS — K089 Disorder of teeth and supporting structures, unspecified: Secondary | ICD-10-CM

## 2014-08-03 DIAGNOSIS — R21 Rash and other nonspecific skin eruption: Secondary | ICD-10-CM

## 2014-08-03 DIAGNOSIS — Z0000 Encounter for general adult medical examination without abnormal findings: Secondary | ICD-10-CM

## 2014-08-03 LAB — CBC WITH DIFFERENTIAL/PLATELET
BASOS ABS: 0 10*3/uL (ref 0.0–0.1)
BASOS PCT: 0 % (ref 0–1)
EOS ABS: 0.2 10*3/uL (ref 0.0–0.7)
EOS PCT: 2 % (ref 0–5)
HEMATOCRIT: 40.3 % (ref 36.0–46.0)
HEMOGLOBIN: 13.6 g/dL (ref 12.0–15.0)
LYMPHS ABS: 2.6 10*3/uL (ref 0.7–4.0)
Lymphocytes Relative: 30 % (ref 12–46)
MCH: 30.4 pg (ref 26.0–34.0)
MCHC: 33.7 g/dL (ref 30.0–36.0)
MCV: 90 fL (ref 78.0–100.0)
MONO ABS: 0.4 10*3/uL (ref 0.1–1.0)
MONOS PCT: 5 % (ref 3–12)
MPV: 11.3 fL (ref 8.6–12.4)
Neutro Abs: 5.4 10*3/uL (ref 1.7–7.7)
Neutrophils Relative %: 63 % (ref 43–77)
Platelets: 255 10*3/uL (ref 150–400)
RBC: 4.48 MIL/uL (ref 3.87–5.11)
RDW: 13.5 % (ref 11.5–15.5)
WBC: 8.6 10*3/uL (ref 4.0–10.5)

## 2014-08-03 LAB — COMPLETE METABOLIC PANEL WITH GFR
ALK PHOS: 59 U/L (ref 39–117)
ALT: 12 U/L (ref 0–35)
AST: 18 U/L (ref 0–37)
Albumin: 4 g/dL (ref 3.5–5.2)
BILIRUBIN TOTAL: 0.4 mg/dL (ref 0.2–1.2)
BUN: 10 mg/dL (ref 6–23)
CO2: 23 mEq/L (ref 19–32)
CREATININE: 0.63 mg/dL (ref 0.50–1.10)
Calcium: 9 mg/dL (ref 8.4–10.5)
Chloride: 103 mEq/L (ref 96–112)
GFR, Est Non African American: >89 mL/min
Glucose, Bld: 76 mg/dL (ref 70–99)
Potassium: 4 mEq/L (ref 3.5–5.3)
SODIUM: 139 meq/L (ref 135–145)
TOTAL PROTEIN: 6.3 g/dL (ref 6.0–8.3)

## 2014-08-03 LAB — TSH: TSH: 3.689 u[IU]/mL (ref 0.350–4.500)

## 2014-08-03 MED ORDER — CLOTRIMAZOLE-BETAMETHASONE 1-0.05 % EX CREA: TOPICAL_CREAM | CUTANEOUS | Status: AC

## 2014-08-03 NOTE — Progress Notes (Addendum)
Patient ID: Krista Soto, female   DOB: 05-02-1990, 24 y.o.   MRN: [id number]   Krista Soto, is a 24 y.o. female  VHQ:[id number]  [id number]   Krista Soto is a 24 y.o. female here today to establish medical care.She denies chronic illnesses. Her only complaint is of a rash under her arms,   No Known Allergies Past Medical History  Diagnosis Date  . No pertinent past medical history    Current Outpatient Prescriptions on File Prior to Visit  Medication Sig Dispense Refill  . Prenatal Vit-Fe Fumarate-FA (PRENATAL MULTIVITAMIN) TABS Take 1 tablet by mouth every morning.     No current facility-administered medications on file prior to visit.   Family History  Problem Relation Age of Onset  . Diabetes Maternal Grandmother   . Diabetes Paternal Grandmother    History   Social History  . Marital Status: Single    Spouse Name: N/A  . Number of Children: N/A  . Years of Education: N/A   Occupational History  . Not on file.   Social History Main Topics  . Smoking status: Never Smoker   . Smokeless tobacco: Never Used  . Alcohol Use: No  . Drug Use: No  . Sexual Activity: Not Currently   Other Topics Concern  . Not on file   Social History Narrative    Review of Systems: Constitutional: Negative for fever, chills, appetite change, weight loss,  fatigue. HENT: Negative for ear pain, ear discharge.nose bleeds Eyes: Negative for pain, discharge, redness, itching and visual disturbance. Neck: Negative for pain, stiffness Respiratory: Negative for cough, shortness of breath,   Cardiovascular: Negative for chest pain, palpitations and leg swelling. Gastrointestinal: Negative for abdominal distention, abdominal pain, nausea, vomiting, diarrhea, constipations Genitourinary: Negative for dysuria, urgency, frequency, hematuria, flank pain,  Musculoskeletal: Negative for back pain, joint pain, joint  swelling, arthralgia and gait problem.Negative for  weakness. Neurological: Negative for dizziness, tremors, seizures, syncope,   light-headedness, numbness and headaches.  Hematological: Negative for easy bruising or bleeding Psychiatric/Behavioral: Negative for depression, anxiety, decreased concentration, confusion   Objective:   Filed Vitals:   08/03/14 1337  BP: 103/57  Pulse: 80  Temp: 97.5 F (36.4 C)  Resp: 16    Physical Exam: Constitutional: Patient appears well-developed and well-nourished. No distress. HENT: Normocephalic, atraumatic, External right and left ear normal. Oropharynx is clear and moist.  Eyes: Conjunctivae and EOM are normal. PERRLA, no scleral icterus. Neck: Normal ROM. Neck supple. No lymphadenopathy, No thyromegaly. CVS: RRR, S1/S2 +, no murmurs, no gallops, no rubs Pulmonary: Effort and breath sounds normal, no stridor, rhonchi, wheezes, rales.  Abdominal: Soft. Normoactive BS,, no distension, tenderness, rebound or guarding.  Musculoskeletal: Normal range of motion. No edema and no tenderness.  Neuro: Alert.Normal muscle tone coordination. Non-focal Skin: Skin is warm and dry. No rash noted. Not diaphoretic. No erythema. No pallor. Psychiatric: Normal mood and affect. Behavior, judgment, thought content normal.  Lab Results  Component Value Date   WBC 11.7* 07/27/2011   HGB 7.9* 07/27/2011   HCT 23.6* 07/27/2011   MCV 84.6 07/27/2011   PLT 184 07/27/2011   No results found for: CREATININE, BUN, NA, K, CL, CO2  No results found for: HGBA1C Lipid Panel  No results found for: CHOL, TRIG, HDL, CHOLHDL, VLDL, LDLCALC     Assessment and plan:   Establish care -reviewed pertinent records and labs -reviewed available health maintenance records. -Cmet with GFr, CBC, TSH She is return in one month for PAP if  she is unable to find a record of one within the last 3 years.  One month for PAP  The patient was given clear instructions to go to ER or return to medical center if symptoms don't  improve, worsen or new problems develop. The patient verbalized understanding. The patient was told to call to get lab results if they haven't heard anything in the next week.     This note has been created with Education officer, environmentalDragon speech recognition software and smart phrase technology. Any transcriptional errors are unintentional.    Henrietta HooverLinda C. Damiel Barthold, MSN, FNP-BC [facility] [place], [place] [phone]   08/03/2014, 1:58 PM

## 2014-08-03 NOTE — Patient Instructions (Signed)
Continue a healthly life style. Diet high in vegetables and fruits and low if fats, cholesterol and sugar Use Cream on rash twice a day for 2 weeks. Schedule an appointment for a pap smear if you have not had within the last three years. Follow-up if rash does not improve and follow-up in 6 months.

## 2014-09-07 ENCOUNTER — Encounter: Payer: No Typology Code available for payment source | Admitting: Family Medicine
